# Patient Record
Sex: Female | Born: 1990
Health system: Southern US, Community
[De-identification: ages and names within clinical notes are randomized; demographics above are authoritative.]

## PROBLEM LIST (undated history)

## (undated) ENCOUNTER — Inpatient Hospital Stay (HOSPITAL_COMMUNITY): Payer: Self-pay

## (undated) ENCOUNTER — Emergency Department (HOSPITAL_COMMUNITY): Payer: Medicaid Other

## (undated) DIAGNOSIS — O0930 Supervision of pregnancy with insufficient antenatal care, unspecified trimester: Secondary | ICD-10-CM

## (undated) DIAGNOSIS — Z789 Other specified health status: Secondary | ICD-10-CM

## (undated) DIAGNOSIS — J189 Pneumonia, unspecified organism: Secondary | ICD-10-CM

## (undated) HISTORY — PX: NO PAST SURGERIES: SHX2092

## (undated) HISTORY — DX: Supervision of pregnancy with insufficient antenatal care, unspecified trimester: O09.30

---
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
## 2009-08-01 ENCOUNTER — Inpatient Hospital Stay (HOSPITAL_COMMUNITY): Admission: AD | Admit: 2009-08-01 | Discharge: 2009-08-01 | Payer: Self-pay | Admitting: Obstetrics and Gynecology

## 2009-08-04 ENCOUNTER — Ambulatory Visit: Admission: RE | Admit: 2009-08-04 | Discharge: 2009-08-04 | Payer: Self-pay | Admitting: Obstetrics and Gynecology

## 2009-08-21 DEATH — deceased

## 2009-08-26 ENCOUNTER — Ambulatory Visit: Payer: Self-pay | Admitting: Obstetrics & Gynecology

## 2010-07-11 LAB — POCT PREGNANCY, URINE: Preg Test, Ur: NEGATIVE

## 2010-07-12 LAB — WET PREP, GENITAL: Trich, Wet Prep: NONE SEEN

## 2010-07-12 LAB — URINALYSIS, ROUTINE W REFLEX MICROSCOPIC
Bilirubin Urine: NEGATIVE
Glucose, UA: NEGATIVE mg/dL
Ketones, ur: NEGATIVE mg/dL
Leukocytes, UA: NEGATIVE
Nitrite: NEGATIVE
Protein, ur: NEGATIVE mg/dL
Specific Gravity, Urine: 1.015 (ref 1.005–1.030)
Urobilinogen, UA: 0.2 mg/dL (ref 0.0–1.0)
pH: 7 (ref 5.0–8.0)

## 2010-07-12 LAB — CBC
HCT: 38.5 % (ref 36.0–46.0)
Hemoglobin: 13.1 g/dL (ref 12.0–15.0)
Platelets: 223 10*3/uL (ref 150–400)
RBC: 4.24 MIL/uL (ref 3.87–5.11)
WBC: 8.1 10*3/uL (ref 4.0–10.5)

## 2010-07-12 LAB — ABO/RH: Weak D: NEGATIVE

## 2010-07-12 LAB — RH IMMUNE GLOBULIN WORKUP (NOT WOMEN'S HOSP)
Antibody Screen: NEGATIVE
Weak D: NEGATIVE

## 2010-07-12 LAB — URINE MICROSCOPIC-ADD ON

## 2010-07-12 LAB — GC/CHLAMYDIA PROBE AMP, GENITAL: GC Probe Amp, Genital: NEGATIVE

## 2010-07-12 LAB — POCT PREGNANCY, URINE: Preg Test, Ur: POSITIVE

## 2010-12-29 ENCOUNTER — Encounter (HOSPITAL_COMMUNITY): Payer: Self-pay | Admitting: *Deleted

## 2010-12-29 ENCOUNTER — Inpatient Hospital Stay (HOSPITAL_COMMUNITY)
Admission: AD | Admit: 2010-12-29 | Discharge: 2010-12-29 | Disposition: A | Discharge status: Home / Self Care | Payer: Medicaid Other | Source: Ambulatory Visit | Attending: Family Medicine | Admitting: Family Medicine

## 2010-12-29 ENCOUNTER — Emergency Department (HOSPITAL_COMMUNITY)
Admission: EM | Admit: 2010-12-29 | Discharge: 2010-12-29 | Discharge status: Against Medical Advice | Payer: Medicaid Other | Source: Home / Self Care | Attending: Emergency Medicine | Admitting: Emergency Medicine

## 2010-12-29 DIAGNOSIS — O2650 Maternal hypotension syndrome, unspecified trimester: Secondary | ICD-10-CM | POA: Insufficient documentation

## 2010-12-29 DIAGNOSIS — Z0389 Encounter for observation for other suspected diseases and conditions ruled out: Secondary | ICD-10-CM | POA: Insufficient documentation

## 2010-12-29 HISTORY — DX: Other specified health status: Z78.9

## 2010-12-29 LAB — CBC
MCH: 31.2 pg (ref 26.0–34.0)
MCV: 89.3 fL (ref 78.0–100.0)
Platelets: 167 10*3/uL (ref 150–400)
RDW: 13.3 % (ref 11.5–15.5)
WBC: 8.8 10*3/uL (ref 4.0–10.5)

## 2010-12-29 LAB — URINALYSIS, ROUTINE W REFLEX MICROSCOPIC
Bilirubin Urine: NEGATIVE
Ketones, ur: NEGATIVE mg/dL
Nitrite: NEGATIVE
Protein, ur: NEGATIVE mg/dL
Urobilinogen, UA: 0.2 mg/dL (ref 0.0–1.0)

## 2010-12-29 NOTE — Progress Notes (Signed)
Pt. In Room 5 of [place]. Pt. States that she passed out at work around 1300 this afternoon.

## 2010-12-29 NOTE — Progress Notes (Signed)
At work standing, saw blackness and fainted, employee states she hit her head, fall was broken by co-workers

## 2011-01-29 LAB — RPR: RPR: NONREACTIVE

## 2011-03-02 DIAGNOSIS — O4690 Antepartum hemorrhage, unspecified, unspecified trimester: Secondary | ICD-10-CM

## 2011-03-17 ENCOUNTER — Inpatient Hospital Stay (HOSPITAL_COMMUNITY)
Admission: AD | Admit: 2011-03-17 | Discharge: 2011-03-18 | Disposition: A | Discharge status: Home / Self Care | Payer: Medicaid Other | Source: Ambulatory Visit | Attending: Obstetrics and Gynecology | Admitting: Obstetrics and Gynecology

## 2011-03-17 ENCOUNTER — Encounter (HOSPITAL_COMMUNITY): Payer: Self-pay

## 2011-03-17 DIAGNOSIS — O4690 Antepartum hemorrhage, unspecified, unspecified trimester: Secondary | ICD-10-CM | POA: Insufficient documentation

## 2011-03-17 LAB — URINE MICROSCOPIC-ADD ON

## 2011-03-17 LAB — URINALYSIS, ROUTINE W REFLEX MICROSCOPIC
Glucose, UA: NEGATIVE mg/dL
Specific Gravity, Urine: <1.005 — ABNORMAL LOW (ref 1.005–1.030)

## 2011-03-17 MED ORDER — RHO D IMMUNE GLOBULIN 1500 UNIT/2ML IJ SOLN
300.0000 ug | Freq: Once | INTRAMUSCULAR | Status: AC
  Administered 2011-03-18: 300 ug via INTRAMUSCULAR

## 2011-03-17 NOTE — ED Provider Notes (Signed)
Cynthia Daniels is a 20 y.o. female presenting for eval of vag bldg immed following intercourse. Denies pain or leaking of fluid. Reports +FM. Rh A neg; is a pt at [facility]; has not received Rhogam yet. Maternal Medical History:  Reason for admission: Reason for Admission:   nausea  OB History    Grav Para Term Preterm Abortions TAB SAB Ect Mult Living   2    1  1         Past Medical History  Diagnosis Date  . No pertinent past medical history    Past Surgical History  Procedure Date  . No past surgeries    Family History: family history is not on file. Social History:  reports that she has never smoked. She does not have any smokeless tobacco history on file. She reports that she does not drink alcohol or use illicit drugs.  Review of Systems  Constitutional: Negative for fever and chills.  Gastrointestinal: Negative for nausea and vomiting.  Genitourinary: Negative for dysuria.  Psychiatric/Behavioral: Negative for depression.    Dilation: Closed Effacement (%): Thick Exam by:: kim Teyla Skidgel cnm Blood pressure 115/66, pulse 78, temperature 98.4 F (36.9 C), temperature source Oral, resp. rate 16, height 5' 4.5" (1.638 m), weight 59.932 kg (132 lb 2 oz), last menstrual period 08/20/2010, SpO2 98.00%. Maternal Exam:  Uterine Assessment: Initially irritability/ctx q 3-7 mins, but spaced out by end of time in MAU  Cervix: SSE shows sm/mod amt blood at cx; no active bldg; cx C/L/high  Fetal Exam Fetal Monitor Review: Baseline rate: 140.  Variability: moderate (6-25 bpm).   Pattern: accelerations present and no decelerations.   approp tracing for gest age     Physical Exam  Prenatal labs: ABO, Rh:   Antibody:   Rubella:   RPR:    HBsAg:    HIV:    GBS:     Assessment/Plan: IUP at 29.6wks Postcoital bldg Rh neg  Rhophylac ordered and given Pelvic rest x 1wk Preterm labor and bldg precautions rev'd F/U at [facility] as sched or sooner prn   Cameryn Schum,  Marcin Holte 03/17/2011, 11:04 PM

## 2011-03-17 NOTE — Progress Notes (Signed)
Patient is here with c/o sudden onset vaginal bleeding with moderate size clots about ago. She states that it started after intercourse. She did not have a pad on enroute to hospital. New pad given. She denies any cramping or pain. Her next ob appt is dec. 4th. She reports good fetal movement.

## 2011-03-19 LAB — RH IG WORKUP (INCLUDES ABO/RH)
Gestational Age(Wks): 29.6
Unit division: 0

## 2011-03-20 NOTE — ED Provider Notes (Signed)
Agree with above note.  Cynthia Daniels 03/20/2011 8:49 AM   

## 2011-05-08 ENCOUNTER — Telehealth (HOSPITAL_COMMUNITY): Payer: Self-pay | Admitting: *Deleted

## 2011-05-08 ENCOUNTER — Encounter (HOSPITAL_COMMUNITY): Payer: Self-pay | Admitting: *Deleted

## 2011-05-08 LAB — GC/CHLAMYDIA PROBE AMP, GENITAL

## 2011-05-08 NOTE — Telephone Encounter (Signed)
Preadmission screen  

## 2011-05-13 ENCOUNTER — Encounter (HOSPITAL_COMMUNITY): Payer: Self-pay | Admitting: Pharmacist

## 2011-05-14 ENCOUNTER — Inpatient Hospital Stay (HOSPITAL_COMMUNITY): Payer: Medicaid Other

## 2011-05-14 ENCOUNTER — Encounter (HOSPITAL_COMMUNITY): Payer: Self-pay

## 2011-05-14 ENCOUNTER — Observation Stay (HOSPITAL_COMMUNITY)
Admission: RE | Admit: 2011-05-14 | Discharge: 2011-05-14 | DRG: 782 | Disposition: A | Discharge status: Hospice | Payer: Medicaid Other | Source: Ambulatory Visit | Attending: Obstetrics & Gynecology | Admitting: Obstetrics & Gynecology

## 2011-05-14 DIAGNOSIS — O365990 Maternal care for other known or suspected poor fetal growth, unspecified trimester, not applicable or unspecified: Secondary | ICD-10-CM

## 2011-05-14 LAB — CBC
HCT: 35.4 % — ABNORMAL LOW (ref 36.0–46.0)
Hemoglobin: 12.1 g/dL (ref 12.0–15.0)
MCHC: 34.2 g/dL (ref 30.0–36.0)
RBC: 3.95 MIL/uL (ref 3.87–5.11)

## 2011-05-14 MED ORDER — OXYTOCIN BOLUS FROM INFUSION
500.0000 mL | Freq: Once | INTRAVENOUS | Status: DC
  Filled 2011-05-14: qty 500

## 2011-05-14 MED ORDER — OXYCODONE-ACETAMINOPHEN 5-325 MG PO TABS
2.0000 | ORAL_TABLET | ORAL | Status: DC | PRN
Start: 2011-05-14 — End: 2011-05-14

## 2011-05-14 MED ORDER — LIDOCAINE HCL (PF) 1 % IJ SOLN: 30.0000 mL | INTRAMUSCULAR | Status: DC | PRN

## 2011-05-14 MED ORDER — OXYTOCIN 20 UNITS IN LACTATED RINGERS INFUSION - SIMPLE
125.0000 mL/h | Freq: Once | INTRAVENOUS | Status: DC
Start: 2011-05-14 — End: 2011-05-14

## 2011-05-14 MED ORDER — ONDANSETRON HCL 4 MG/2ML IJ SOLN: 4.0000 mg | Freq: Four times a day (QID) | INTRAMUSCULAR | Status: DC | PRN

## 2011-05-14 MED ORDER — FLEET ENEMA 7-19 GM/118ML RE ENEM: 1.0000 | ENEMA | RECTAL | Status: DC | PRN

## 2011-05-14 MED ORDER — LACTATED RINGERS IV SOLN
INTRAVENOUS | Status: DC
  Administered 2011-05-14: 08:00:00 via INTRAVENOUS

## 2011-05-14 MED ORDER — IBUPROFEN 600 MG PO TABS: 600.0000 mg | ORAL_TABLET | Freq: Four times a day (QID) | ORAL | Status: DC | PRN

## 2011-05-14 MED ORDER — PENICILLIN G POTASSIUM 5000000 UNITS IJ SOLR: 5.0000 10*6.[IU] | Freq: Once | INTRAVENOUS | Status: DC

## 2011-05-14 MED ORDER — CITRIC ACID-SODIUM CITRATE 334-500 MG/5ML PO SOLN: 30.0000 mL | ORAL | Status: DC | PRN

## 2011-05-14 MED ORDER — ACETAMINOPHEN 325 MG PO TABS: 650.0000 mg | ORAL_TABLET | ORAL | Status: DC | PRN

## 2011-05-14 MED ORDER — PENICILLIN G POTASSIUM 5000000 UNITS IJ SOLR: 2.5000 10*6.[IU] | INTRAVENOUS | Status: DC

## 2011-05-14 MED ORDER — LACTATED RINGERS IV SOLN: 500.0000 mL | INTRAVENOUS | Status: DC | PRN

## 2011-05-14 NOTE — Discharge Summary (Signed)
Obstetric Discharge Summary Reason for Admission: induction of labor  Procedures Performed: Ultrasound showing normal intrauterine pregnancy with Fetal Wt at 45%ile  Hemoglobin  Date Value Range Status  05/14/2011 12.1  12.0-15.0 (g/dL) Final     HCT  Date Value Range Status  05/14/2011 35.4* 36.0-46.0 (%) Final    Discharge Diagnoses: Intrauterine Pregnancy, not delivered,   Clinical Course: the patient was scheduled for induction of labor at 38.1wks secondary for IUGR. Apon review of the patients chart, IUGR was not obviously evident. A Repeat Ultrasound was performed and showed viable single intratuerine fetus with estimated weight at 45%ile. The patient was not indicated for induction at this time, and was discharged to home to follow up with her OBGYN within the week for further evaluation. The patient expressed understanding and agreement.   Discharge Information: Date: 05/14/2011 Activity: unrestricted and pelvic rest Diet: routine Medications: PNV Condition: stable Instructions: refer to practice specific booklet Discharge to: home Follow-up Information    Follow up with Lazaro Arms, MD. Schedule an appointment as soon as possible for a visit in 3 days.   Contact information:   [facility] [address] [place] [address] [phone]           Cameron Proud 05/14/2011, 4:18 PM

## 2011-05-16 NOTE — Discharge Summary (Signed)
Agree with note. 

## 2011-05-25 ENCOUNTER — Inpatient Hospital Stay (HOSPITAL_COMMUNITY)
Admission: AD | Admit: 2011-05-25 | Discharge: 2011-05-27 | DRG: 775 | Disposition: A | Discharge status: Home / Self Care | Payer: Medicaid Other | Source: Ambulatory Visit | Attending: Obstetrics and Gynecology | Admitting: Obstetrics and Gynecology

## 2011-05-25 ENCOUNTER — Encounter (HOSPITAL_COMMUNITY): Payer: Self-pay | Admitting: *Deleted

## 2011-05-25 DIAGNOSIS — O99892 Other specified diseases and conditions complicating childbirth: Secondary | ICD-10-CM | POA: Diagnosis present

## 2011-05-25 DIAGNOSIS — IMO0001 Reserved for inherently not codable concepts without codable children: Secondary | ICD-10-CM

## 2011-05-25 DIAGNOSIS — Z22330 Carrier of Group B streptococcus: Secondary | ICD-10-CM

## 2011-05-25 LAB — CBC
MCH: 30.6 pg (ref 26.0–34.0)
MCHC: 34.6 g/dL (ref 30.0–36.0)
MCV: 88.3 fL (ref 78.0–100.0)
RBC: 3.86 MIL/uL — ABNORMAL LOW (ref 3.87–5.11)
WBC: 11.7 10*3/uL — ABNORMAL HIGH (ref 4.0–10.5)

## 2011-05-25 MED ORDER — OXYTOCIN BOLUS FROM INFUSION
500.0000 mL | Freq: Once | INTRAVENOUS | Status: DC
  Filled 2011-05-25: qty 500
  Filled 2011-05-25: qty 1000

## 2011-05-25 MED ORDER — CITRIC ACID-SODIUM CITRATE 334-500 MG/5ML PO SOLN: 30.0000 mL | ORAL | Status: DC | PRN

## 2011-05-25 MED ORDER — ONDANSETRON HCL 4 MG/2ML IJ SOLN: 4.0000 mg | Freq: Four times a day (QID) | INTRAMUSCULAR | Status: DC | PRN

## 2011-05-25 MED ORDER — BUTORPHANOL TARTRATE 2 MG/ML IJ SOLN: 1.0000 mg | INTRAMUSCULAR | Status: DC | PRN

## 2011-05-25 MED ORDER — LACTATED RINGERS IV SOLN
INTRAVENOUS | Status: DC
  Administered 2011-05-25: 125 mL/h via INTRAVENOUS
  Administered 2011-05-25: 22:00:00 via INTRAVENOUS

## 2011-05-25 MED ORDER — OXYTOCIN 20 UNITS IN LACTATED RINGERS INFUSION - SIMPLE
125.0000 mL/h | Freq: Once | INTRAVENOUS | Status: AC
  Administered 2011-05-26: 1000 mL/h via INTRAVENOUS

## 2011-05-25 MED ORDER — IBUPROFEN 600 MG PO TABS
600.0000 mg | ORAL_TABLET | Freq: Four times a day (QID) | ORAL | Status: DC | PRN
  Administered 2011-05-26: 600 mg via ORAL
  Filled 2011-05-25: qty 1

## 2011-05-25 MED ORDER — LIDOCAINE HCL (PF) 1 % IJ SOLN
30.0000 mL | INTRAMUSCULAR | Status: DC | PRN
  Administered 2011-05-26: 30 mL via SUBCUTANEOUS
  Filled 2011-05-25: qty 30

## 2011-05-25 MED ORDER — OXYCODONE-ACETAMINOPHEN 5-325 MG PO TABS: 1.0000 | ORAL_TABLET | ORAL | Status: DC | PRN

## 2011-05-25 MED ORDER — PENICILLIN G POTASSIUM 5000000 UNITS IJ SOLR
5.0000 10*6.[IU] | Freq: Once | INTRAVENOUS | Status: AC
  Administered 2011-05-25: 5 10*6.[IU] via INTRAVENOUS
  Filled 2011-05-25: qty 5

## 2011-05-25 MED ORDER — PENICILLIN G POTASSIUM 5000000 UNITS IJ SOLR
2.5000 10*6.[IU] | INTRAMUSCULAR | Status: DC
  Administered 2011-05-26: 2.5 10*6.[IU] via INTRAVENOUS
  Filled 2011-05-25 (×4): qty 2.5

## 2011-05-25 MED ORDER — ACETAMINOPHEN 325 MG PO TABS: 650.0000 mg | ORAL_TABLET | ORAL | Status: DC | PRN

## 2011-05-25 MED ORDER — FLEET ENEMA 7-19 GM/118ML RE ENEM: 1.0000 | ENEMA | RECTAL | Status: DC | PRN

## 2011-05-25 MED ORDER — LACTATED RINGERS IV SOLN: 500.0000 mL | INTRAVENOUS | Status: DC | PRN

## 2011-05-25 NOTE — ED Notes (Signed)
Report called to [facility] in [place]. Pt to 171 via w/c

## 2011-05-25 NOTE — ED Provider Notes (Signed)
History reviewed.  Patient well known to me, from prenatal care at [facility]. Admitted for labor and delivery. Agree with plan and documentation

## 2011-05-25 NOTE — ED Notes (Signed)
Threasa Heads CNM notified of pt's admission and status. Aware of sve, maternal pulse 120, and FHR baseline 130 before walking and now 150 and reactive. Will admit to [facility]

## 2011-05-25 NOTE — Progress Notes (Signed)
Family at bedside and supportive.

## 2011-05-25 NOTE — ED Provider Notes (Addendum)
History     Chief Complaint  Patient presents with  . Labor Eval   HPI This is a 21 year old G2P0010 at 39 weeks and 5 days who presents to the MAU with contractions that she reports as moderate and occuring every 5-8 minutes that started earlier this morning.  She denies bloody show, vaginal bleeding, vaginal discharge, leaking fluid, headache, vision changes.  Contractions get worse with walking.  OB History    Grav Para Term Preterm Abortions TAB SAB Ect Mult Living   2 0 0 0 1 0 1 0 0 0       Past Medical History  Diagnosis Date  . No pertinent past medical history   . Late prenatal care     Past Surgical History  Procedure Date  . No past surgeries     Family History  Problem Relation Age of Onset  . Asthma Mother   . Depression Mother   . Asthma Maternal Grandmother   . Anesthesia problems Neg Hx   . Hypotension Neg Hx   . Malignant hyperthermia Neg Hx   . Pseudochol deficiency Neg Hx     History  Substance Use Topics  . Smoking status: Never Smoker   . Smokeless tobacco: Not on file  . Alcohol Use: No    Allergies: No Known Allergies  Prescriptions prior to admission  Medication Sig Dispense Refill  . Prenatal Vit-Fe Fumarate-FA (PRENATAL MULTIVITAMIN) TABS Take 1 tablet by mouth at bedtime.        Review of Systems  All other systems reviewed and are negative.   Physical Exam   Blood pressure 116/74, pulse 90, temperature 98.6 F (37 C), temperature source Oral, resp. rate 20, height 5\' 5"  (1.651 m), weight 62.596 kg (138 lb), last menstrual period 08/20/2010, SpO2 99.00%.  Physical Exam  Constitutional: She is oriented to person, place, and time. She appears well-developed and well-nourished. No distress.  GI: Soft. Bowel sounds are normal.       Fundal height at term.  Musculoskeletal: Normal range of motion.  Neurological: She is alert and oriented to person, place, and time. No cranial nerve deficit. Coordination normal.  Skin: Skin is  warm and dry.  Psychiatric: She has a normal mood and affect. Her behavior is normal. Judgment and thought content normal.   Dilation: 4 Effacement (%): 70 Station: -1 Presentation: Vertex Exam by:: Dt Stinson  FHT: 140s, mod variability, accelerations present, no decels Toco: every 3-5 minutes - patient not feeling every contraction  Recheck: 5/90/-1  MAU Course  Procedures  MDM Patient sent to ambulate for 1 hr.  Assessment and Plan  Patient signed out to [facility].   STINSON, JACOB JEHIEL 05/25/2011, 7:50 PM   Assessment: Active labor GBS +  Plan: Admit to [facility] GBS prophylaxis Anticipate NSVD [facility]

## 2011-05-25 NOTE — H&P (Signed)
Note reviewed and confirmed.  Good prognosis for vag delivery

## 2011-05-25 NOTE — Progress Notes (Signed)
Dr Adrian Blackwater discussing plan of care with pt.

## 2011-05-25 NOTE — Progress Notes (Signed)
OUT TO WALK WITH FRIEND- X1 HR- WITH INSTR

## 2011-05-25 NOTE — H&P (Signed)
History     Chief Complaint  Patient presents with  . Labor Eval   HPI This is a 20 year old G2P0010 at 39 weeks and 5 days who presents to the MAU with contractions that she reports as moderate and occuring every 5-8 minutes that started earlier this morning.  She denies bloody show, vaginal bleeding, vaginal discharge, leaking fluid, headache, vision changes.  Contractions get worse with walking.  OB History    Grav Para Term Preterm Abortions TAB SAB Ect Mult Living   2 0 0 0 1 0 1 0 0 0      Past Medical History  Diagnosis Date  . No pertinent past medical history   . Late prenatal care     Past Surgical History  Procedure Date  . No past surgeries     Family History  Problem Relation Age of Onset  . Asthma Mother   . Depression Mother   . Asthma Maternal Grandmother   . Anesthesia problems Neg Hx   . Hypotension Neg Hx   . Malignant hyperthermia Neg Hx   . Pseudochol deficiency Neg Hx     History  Substance Use Topics  . Smoking status: Never Smoker   . Smokeless tobacco: Not on file  . Alcohol Use: No    Allergies: No Known Allergies  Prescriptions prior to admission  Medication Sig Dispense Refill  . Prenatal Vit-Fe Fumarate-FA (PRENATAL MULTIVITAMIN) TABS Take 1 tablet by mouth at bedtime.        Review of Systems  All other systems reviewed and are negative.   Physical Exam   Blood pressure 116/74, pulse 90, temperature 98.6 F (37 C), temperature source Oral, resp. rate 20, height 5' 5" (1.651 m), weight 62.596 kg (138 lb), last menstrual period 08/20/2010, SpO2 99.00%.  Physical Exam  Constitutional: She is oriented to person, place, and time. She appears well-developed and well-nourished. No distress.  GI: Soft. Bowel sounds are normal.       Fundal height at term.  Musculoskeletal: Normal range of motion.  Neurological: She is alert and oriented to person, place, and time. No cranial nerve deficit. Coordination normal.  Skin: Skin is  warm and dry.  Psychiatric: She has a normal mood and affect. Her behavior is normal. Judgment and thought content normal.   Dilation: 4 Effacement (%): 70 Station: -1 Presentation: Vertex Exam by:: Dt Stinson  FHT: 140s, mod variability, accelerations present, no decels Toco: every 3-5 minutes - patient not feeling every contraction  Recheck: 5/90/-1  MAU Course  Procedures  MDM Patient sent to ambulate for 1 hr.  Assessment and Plan  Patient signed out to Yahia Bottger Muhammad.   STINSON, JACOB JEHIEL 05/25/2011, 7:50 PM   Assessment: Active labor GBS +  Plan: Admit to [facility] Suites GBS prophylaxis Anticipate NSVD MUHAMMAD,Keyonna [place]  

## 2011-05-25 NOTE — ED Notes (Signed)
2120 Call to [facility] RN to give report. With pt and will call back to MAU when can.

## 2011-05-25 NOTE — Progress Notes (Signed)
Dull ache in back all afternoon.  Has been contracting q 8-10.  No bleeding or leaking.  G2 , was 3-4 yesterday. No problems with preg

## 2011-05-26 ENCOUNTER — Encounter (HOSPITAL_COMMUNITY): Payer: Self-pay | Admitting: Obstetrics

## 2011-05-26 MED ORDER — PRENATAL MULTIVITAMIN CH
1.0000 | ORAL_TABLET | Freq: Every day | ORAL | Status: DC
  Filled 2011-05-26 (×2): qty 1

## 2011-05-26 MED ORDER — DIBUCAINE 1 % RE OINT: 1.0000 "application " | TOPICAL_OINTMENT | RECTAL | Status: DC | PRN

## 2011-05-26 MED ORDER — PRENATAL MULTIVITAMIN CH
1.0000 | ORAL_TABLET | Freq: Every day | ORAL | Status: DC
  Administered 2011-05-26 – 2011-05-27 (×2): 1 via ORAL

## 2011-05-26 MED ORDER — WITCH HAZEL-GLYCERIN EX PADS: 1.0000 "application " | MEDICATED_PAD | CUTANEOUS | Status: DC | PRN

## 2011-05-26 MED ORDER — BENZOCAINE-MENTHOL 20-0.5 % EX AERO: 1.0000 "application " | INHALATION_SPRAY | CUTANEOUS | Status: DC | PRN

## 2011-05-26 MED ORDER — TETANUS-DIPHTH-ACELL PERTUSSIS 5-2.5-18.5 LF-MCG/0.5 IM SUSP
0.5000 mL | Freq: Once | INTRAMUSCULAR | Status: AC
  Administered 2011-05-27: 0.5 mL via INTRAMUSCULAR
  Filled 2011-05-26: qty 0.5

## 2011-05-26 MED ORDER — IBUPROFEN 600 MG PO TABS
600.0000 mg | ORAL_TABLET | Freq: Four times a day (QID) | ORAL | Status: DC
  Administered 2011-05-26 – 2011-05-27 (×5): 600 mg via ORAL
  Filled 2011-05-26 (×5): qty 1

## 2011-05-26 MED ORDER — LANOLIN HYDROUS EX OINT: TOPICAL_OINTMENT | CUTANEOUS | Status: DC | PRN

## 2011-05-26 MED ORDER — ZOLPIDEM TARTRATE 5 MG PO TABS: 5.0000 mg | ORAL_TABLET | Freq: Every evening | ORAL | Status: DC | PRN

## 2011-05-26 MED ORDER — ONDANSETRON HCL 4 MG/2ML IJ SOLN: 4.0000 mg | INTRAMUSCULAR | Status: DC | PRN

## 2011-05-26 MED ORDER — OXYCODONE-ACETAMINOPHEN 5-325 MG PO TABS
1.0000 | ORAL_TABLET | ORAL | Status: DC | PRN
  Administered 2011-05-26 – 2011-05-27 (×2): 1 via ORAL
  Filled 2011-05-26 (×2): qty 1

## 2011-05-26 MED ORDER — ONDANSETRON HCL 4 MG PO TABS: 4.0000 mg | ORAL_TABLET | ORAL | Status: DC | PRN

## 2011-05-26 MED ORDER — DIPHENHYDRAMINE HCL 25 MG PO CAPS: 25.0000 mg | ORAL_CAPSULE | Freq: Four times a day (QID) | ORAL | Status: DC | PRN

## 2011-05-26 MED ORDER — SIMETHICONE 80 MG PO CHEW: 80.0000 mg | CHEWABLE_TABLET | ORAL | Status: DC | PRN

## 2011-05-26 MED ORDER — SENNOSIDES-DOCUSATE SODIUM 8.6-50 MG PO TABS
2.0000 | ORAL_TABLET | Freq: Every day | ORAL | Status: DC
  Administered 2011-05-26: 2 via ORAL

## 2011-05-26 NOTE — Progress Notes (Signed)
  Subjective: Pt reports increase in contraction pain.  Objective: BP 123/80  Pulse 71  Temp(Src) 98 F (36.7 C) (Oral)  Resp 18  Ht 5\' 5"  (1.651 m)  Wt 62.596 kg (138 lb)  BMI 22.96 kg/m2  SpO2 97%  LMP 08/20/2010      FHT:  FHR: 120's bpm, variability: moderate,  accelerations:  Present,  decelerations:  Absent UC:   regular, every 2-4 minutes SVE:   Dilation: 6 Effacement (%): 100 Station: 0 Exam by:: Larose Kells RN  Labs: Lab Results  Component Value Date   WBC 11.7* 05/25/2011   HGB 11.8* 05/25/2011   HCT 34.1* 05/25/2011   MCV 88.3 05/25/2011   PLT 210 05/25/2011    Assessment / Plan: Spontaneous labor, progressing normally  Labor: Progressing normally Preeclampsia:  n/a Fetal Wellbeing:  Category I Pain Control:  Labor support without medications I/D:  n/a Anticipated MOD:  NSVD  Intermittent monitoring; encouraged position changes (ambulation, out of bed)  [facility] 05/26/2011, 3:27 AM

## 2011-05-26 NOTE — Progress Notes (Signed)
Called [name], CNM to report no cervical change.  States she will come break pt's water.

## 2011-05-26 NOTE — Progress Notes (Signed)
Subjective: Pt reports able to tolerate pain; declines epidural at this time.  No questions or concerns.    Objective: BP 131/82  Pulse 83  Temp(Src) 98 F (36.7 C) (Oral)  Resp 20  Ht 5\' 5"  (1.651 m)  Wt 62.596 kg (138 lb)  BMI 22.96 kg/m2  SpO2 97%  LMP 08/20/2010      FHT:  FHR: 140's bpm, variability: moderate,  accelerations:  Present,  decelerations:  Absent UC:   regular, every 3-4.5 minutes SVE:   Dilation: 5 Effacement (%): 90 Station: -1 Exam by:: Quintella Baton RNC  Labs: Lab Results  Component Value Date   WBC 11.7* 05/25/2011   HGB 11.8* 05/25/2011   HCT 34.1* 05/25/2011   MCV 88.3 05/25/2011   PLT 210 05/25/2011    Assessment / Plan: Active Labor  Labor: Slow Progression Preeclampsia:  n/a Fetal Wellbeing:  Category I Pain Control:  Labor support without medications I/D:  n/a Anticipated MOD:  NSVD  AROM>clear fluid  [facility] 05/26/2011, 1:04 AM

## 2011-05-26 NOTE — Progress Notes (Signed)
Patient ID: Cynthia Daniels, female   DOB: 1991-02-25, 21 y.o.   MRN: [id number] Delivery Note At 4:38 AM a viable and healthy female was delivered via Vertex Occiput Anterior (Presentation: ;  ).  APGAR :8 ,9 ; weight .  6lb 8 oz Placenta status:Schul;tz presentation, intact, 3vc  , .  Cord:3VC  with the following complications:small left periurethral laceration, and perineal laceration with assymmetric superficial skin flap extending to the patient's right side .  Cord pH: n/a  Anesthesia:  Local, 10cc Episiotomy: none  Lacerations: as above Suture Repair: 3.0 monocryl Est. Blood Loss (mL):  350 Mom to postpartum.  Baby to nursery-stable.  Walsie Smeltz V 05/26/2011, 5:09 AM

## 2011-05-27 MED ORDER — IBUPROFEN 600 MG PO TABS: 600.0000 mg | ORAL_TABLET | Freq: Four times a day (QID) | ORAL | Status: AC

## 2011-05-27 MED ORDER — OXYCODONE-ACETAMINOPHEN 5-325 MG PO TABS: 1.0000 | ORAL_TABLET | ORAL | Status: AC | PRN

## 2011-05-27 MED ORDER — RHO D IMMUNE GLOBULIN 1500 UNIT/2ML IJ SOLN
300.0000 ug | Freq: Once | INTRAMUSCULAR | Status: AC
  Administered 2011-05-27: 300 ug via INTRAMUSCULAR
  Filled 2011-05-27: qty 2

## 2011-05-27 NOTE — Discharge Summary (Signed)
Obstetric Discharge Summary Reason for Admission: onset of labor Prenatal Procedures: ultrasound Intrapartum Procedures: spontaneous vaginal delivery Postpartum Procedures: none Complications-Operative and Postpartum: none Hemoglobin  Date Value Range Status  05/25/2011 11.8* 12.0-15.0 (g/dL) Final     HCT  Date Value Range Status  05/25/2011 34.1* 36.0-46.0 (%) Final    Discharge Diagnoses: Term Pregnancy-delivered  Discharge Information: Date: 05/27/2011 Activity: pelvic rest Diet: routine Medications: PNV, Ibuprofen and Percocet Condition: stable and improved Instructions: refer to practice specific booklet Discharge to: home   Newborn Data: Live born female  Birth Weight: 6 lb 8.6 oz (2965 g) APGAR: 9, 9  Home with mother.  Zerita Boers 05/27/2011, 10:43 AM

## 2011-05-27 NOTE — Progress Notes (Signed)
Post Partum Day 1 Subjective: no complaints, up ad lib, voiding and tolerating PO  Objective: Blood pressure 99/62, pulse 80, temperature 98.2 F (36.8 C), temperature source Oral, resp. rate 16, height 5\' 5"  (1.651 m), weight 62.596 kg (138 lb), last menstrual period 08/20/2010, SpO2 98.00%, unknown if currently breastfeeding.  Physical Exam:  General: alert, cooperative, appears stated age and no distress Lochia: appropriate Uterine Fundus: firm Incision: n/a DVT Evaluation: No evidence of DVT seen on physical exam. Negative Homan's sign. No cords or calf tenderness. No significant calf/ankle edema.   Basename 05/25/11 2130  HGB 11.8*  HCT 34.1*    Assessment/Plan: Discharge home, Breastfeeding and Contraception DMPA   LOS: 2 days   Zerita Boers 05/27/2011, 10:39 AM

## 2011-05-27 NOTE — Discharge Summary (Signed)
Agree with above note.  Kamber Vignola H. 05/27/2011 4:36 PM  

## 2011-05-28 LAB — RH IG WORKUP (INCLUDES ABO/RH): Gestational Age(Wks): 39

## 2011-05-28 NOTE — Progress Notes (Signed)
Post discharge chart review completed.  

## 2011-08-14 NOTE — MAU Provider Note (Signed)
Chart reviewed and agree with management and plan.  

## 2011-08-14 NOTE — MAU Provider Note (Signed)
Chief Complaint:  Loss of Consciousness and Dizziness   Cynthia Daniels is  21 y.o. G2P1011.  Patient's last menstrual period was 08/20/2010..   She presents complaining of Loss of Consciousness and Dizziness  Obstetrical/Gynecological History: OB History as of 06/10/11    Grav Para Term Preterm Abortions TAB SAB Ect Mult Living   2 1 1  0 1 0 1 0 0 1      Past Medical History: Past Medical History  Diagnosis Date  . No pertinent past medical history   . Late prenatal care     Past Surgical History: Past Surgical History  Procedure Date  . No past surgeries     Family History: Family History  Problem Relation Age of Onset  . Asthma Mother   . Depression Mother   . Asthma Maternal Grandmother   . Anesthesia problems Neg Hx   . Hypotension Neg Hx   . Malignant hyperthermia Neg Hx   . Pseudochol deficiency Neg Hx     Social History: History  Substance Use Topics  . Smoking status: Never Smoker   . Smokeless tobacco: Not on file  . Alcohol Use: No    Allergies: No Known Allergies  No prescriptions prior to admission    Review of Systems - Negative except what has been reviewed in the HPI  Physical Exam   Blood pressure 102/60, pulse 69, temperature 98.1 F (36.7 C), temperature source Oral, resp. rate 14, weight 120 lb 4 oz (54.545 kg), last menstrual period 08/20/2010, unknown if currently breastfeeding.  General: General appearance - alert, well appearing, and in no distress and oriented to person, place, and time Mental status - alert, oriented to person, place, and time, normal mood, behavior, speech, dress, motor activity, and thought processes, affect appropriate to mood Chest - clear to auscultation, no wheezes, rales or rhonchi, symmetric air entry Heart - normal rate, regular rhythm, normal S1, S2, no murmurs, rubs, clicks or gallops Abdomen - soft, nontender, nondistended, no masses or organomegaly Neurological - alert, oriented, normal speech, no  focal findings or movement disorder noted, neck supple without rigidity, cranial nerves II through XII intact, DTR's normal and symmetric Extremities - peripheral pulses normal, no pedal edema, no clubbing or cyanosis Focused Gynecological Exam: examination not indicated  Labs: No results found for this or any previous visit (from the past 24 hour(s)). Imaging Studies:  No results found.   Assessment: 1. Maternal hypotension syndrome     Plan: Discharge home Precautions reviewed Folow up as schedule in office.  Tag Wurtz E.

## 2012-03-16 ENCOUNTER — Emergency Department (HOSPITAL_COMMUNITY)
Admission: EM | Admit: 2012-03-16 | Discharge: 2012-03-16 | Disposition: A | Discharge status: Home / Self Care | Payer: Self-pay | Source: Home / Self Care | Attending: Emergency Medicine | Admitting: Emergency Medicine

## 2012-03-16 ENCOUNTER — Encounter (HOSPITAL_COMMUNITY): Payer: Self-pay

## 2012-03-16 DIAGNOSIS — L0291 Cutaneous abscess, unspecified: Secondary | ICD-10-CM

## 2012-03-16 MED ORDER — CHLORHEXIDINE GLUCONATE 4 % EX LIQD: 60.0000 mL | Freq: Every day | CUTANEOUS | Status: DC | PRN

## 2012-03-16 MED ORDER — HYDROCODONE-ACETAMINOPHEN 5-325 MG PO TABS: ORAL_TABLET | ORAL | Status: DC

## 2012-03-16 MED ORDER — SULFAMETHOXAZOLE-TMP DS 800-160 MG PO TABS: 2.0000 | ORAL_TABLET | Freq: Two times a day (BID) | ORAL | Status: DC

## 2012-03-16 MED ORDER — MUPIROCIN 2 % EX OINT: TOPICAL_OINTMENT | Freq: Three times a day (TID) | CUTANEOUS | Status: DC

## 2012-03-16 NOTE — ED Provider Notes (Signed)
Chief Complaint  Patient presents with  . Recurrent Skin Infections    History of Present Illness:    Cynthia Daniels is a 21 year old female who has had a one-week history of boils in both her axillas. The boil in the right axilla has been draining. She has a history of a boil on her knee several months ago and her mother had a history of MRSA. She herself denies any personal history of MRSA or diabetes. She has had no fever or chills.  Review of Systems:  Other than noted above, the patient denies any of the following symptoms: Systemic:  No fever, chills or sweats. Skin:  No rash or itching.  PMFSH:  Past medical history, family history, social history, meds, and allergies were reviewed.  No history of diabetes or prior history of abscesses or MRSA.  Physical Exam:   Vital signs:  BP 134/89  Pulse 98  Temp 99.4 F (37.4 C) (Oral)  Resp 18  SpO2 100%  LMP 03/02/2012 Skin:  She has 5 boils in all, the largest is in the right axilla and measures 2.5 cm it is fluctuant. There is a boil just medial to this measuring just a centimeter and it's not fluctuant. She has 3 small boils in the left axilla all measuring about 1.5 cm in size and are not fluctuant or draining.  Skin exam was otherwise normal.  No rash. Ext:  Distal pulses were full, patient has full ROM of all joints.  Procedure:  Verbal informed consent was obtained.  The patient was informed of the risks and benefits of the procedure and understands and accepts.  Identity of the patient was verified verbally and by wristband.   The larger abscess in the right axillary area described above was prepped with Betadine and alcohol and anesthetized with 3 mL of 2% Xylocaine with epinephrine.  Using a #11 scalpel blade, a singe straight incision was made into the area of fluctulence, yielding a large amount of prurulent drainage.  Routine cultures were obtained.  Blunt dissection was used to break up loculations and the resulting wound cavity was  packed with 1/4 inch Iodoform gauze.  A sterile pressure dressing was applied.  Assessment:  The encounter diagnosis was Abscess.  Plan:   1.  The following meds were prescribed:   New Prescriptions   CHLORHEXIDINE (HIBICLENS) 4 % EXTERNAL LIQUID    Apply 60 mLs (4 application total) topically daily as needed.   HYDROCODONE-ACETAMINOPHEN (NORCO/VICODIN) 5-325 MG PER TABLET    1 to 2 tabs every 4 to 6 hours as needed for pain.   MUPIROCIN OINTMENT (BACTROBAN) 2 %    Apply topically 3 (three) times daily.   SULFAMETHOXAZOLE-TRIMETHOPRIM (BACTRIM DS) 800-160 MG PER TABLET    Take 2 tablets by mouth 2 (two) times daily.   2.  The patient was instructed in symptomatic care and handouts were given. 3.  The patient was instructed to leave the dressing in place and return again in 48 hours for packing removal.   Reuben Likes, MD 03/16/12 1921

## 2012-03-16 NOTE — ED Notes (Signed)
Reported boils under both arms , already draining

## 2012-03-18 ENCOUNTER — Encounter (HOSPITAL_COMMUNITY): Payer: Self-pay | Admitting: *Deleted

## 2012-03-18 ENCOUNTER — Emergency Department (INDEPENDENT_AMBULATORY_CARE_PROVIDER_SITE_OTHER)
Admission: EM | Admit: 2012-03-18 | Discharge: 2012-03-18 | Disposition: A | Discharge status: Home / Self Care | Payer: Self-pay | Source: Home / Self Care | Attending: Emergency Medicine | Admitting: Emergency Medicine

## 2012-03-18 DIAGNOSIS — Z5189 Encounter for other specified aftercare: Secondary | ICD-10-CM

## 2012-03-18 DIAGNOSIS — Z4800 Encounter for change or removal of nonsurgical wound dressing: Secondary | ICD-10-CM

## 2012-03-18 NOTE — ED Notes (Signed)
pT  HERE  FOR  WOUND  CHECK  AND  PACKING  REMOVAL        PT REPORTS  SHE  IS  DOING  BETTER  SHE  REPORTS  SHE  GOT  HER  MEDS    FILLED

## 2012-03-18 NOTE — ED Provider Notes (Addendum)
History     CSN: [id number]  Arrival date & time 03/18/12  7829   First MD Initiated Contact with Patient 03/18/12 0920      Chief Complaint  Patient presents with  . Wound Check    (Consider location/radiation/quality/duration/timing/severity/associated sxs/prior treatment) HPI Comments: Patient returns to have packing removal for abscess wound recheck. Tolerating antibiotics well feeling better, mild nausea with antibiotics she has continued to take it. Feels a soreness and swelling have improved remarkably. Denies fevers chills or nails no symptoms or  Patient is a 21 y.o. female presenting with wound check. The history is provided by the patient.  Wound Check  She was treated in the ED today. The fever has been present for 1 to 2 days. Her temperature was unmeasured prior to arrival. There has been bloody discharge from the wound. The redness has improved. The swelling has improved. The pain has no pain.    Past Medical History  Diagnosis Date  . No pertinent past medical history   . Late prenatal care     Past Surgical History  Procedure Date  . No past surgeries     Family History  Problem Relation Age of Onset  . Asthma Mother   . Depression Mother   . Asthma Maternal Grandmother   . Anesthesia problems Neg Hx   . Hypotension Neg Hx   . Malignant hyperthermia Neg Hx   . Pseudochol deficiency Neg Hx     History  Substance Use Topics  . Smoking status: Never Smoker   . Smokeless tobacco: Not on file  . Alcohol Use: No    OB History    Grav Para Term Preterm Abortions TAB SAB Ect Mult Living   2 1 1  0 1 0 1 0 0 1      Review of Systems  Constitutional: Positive for activity change. Negative for fever, chills, diaphoresis, appetite change and fatigue.  Skin: Positive for wound.    Allergies  Review of patient's allergies indicates no known allergies.  Home Medications   Current Outpatient Rx  Name  Route  Sig  Dispense  Refill  . CHLORHEXIDINE  GLUCONATE 4 % EX LIQD   Topical   Apply 60 mLs (4 application total) topically daily as needed.   237 mL   0   . HYDROCODONE-ACETAMINOPHEN 5-325 MG PO TABS      1 to 2 tabs every 4 to 6 hours as needed for pain.   20 tablet   0   . MUPIROCIN 2 % EX OINT   Topical   Apply topically 3 (three) times daily.   22 g   0   . PRENATAL MULTIVITAMIN CH   Oral   Take 1 tablet by mouth at bedtime.         . SULFAMETHOXAZOLE-TMP DS 800-160 MG PO TABS   Oral   Take 2 tablets by mouth 2 (two) times daily.   40 tablet   0     BP 129/83  Pulse 90  Temp 99.2 F (37.3 C) (Oral)  Resp 16  SpO2 99%  LMP 03/02/2012  Physical Exam  Constitutional: She appears well-developed and well-nourished.  Non-toxic appearance. She does not have a sickly appearance. No distress.  HENT:  Head: Normocephalic.  Eyes: Conjunctivae normal are normal.  Neck: Neck supple.  Musculoskeletal:       Right shoulder: She exhibits normal range of motion, no tenderness, no bony tenderness and no swelling.  Arms: Neurological: She is alert.  Skin: No rash noted. There is erythema.    ED Course  Procedures (including critical care time)  Labs Reviewed - No data to display No results found.   1. Wound check, abscess     GRAM POSITIVE COCCI IN PAIRS IN CLUSTERS Culture MODERATE STAPHYLOCOCCUS AUREUS    MDM  Follow-up visit for wound recheck- tolerating antibiotics + clinical  Improvement. To RTC if any further concerns.        Jimmie Molly, MD 03/18/12 1610  Jimmie Molly, MD 03/18/12 1110  Jimmie Molly, MD 03/18/12 [phone]

## 2012-03-19 LAB — CULTURE, ROUTINE-ABSCESS

## 2012-03-27 ENCOUNTER — Telehealth (HOSPITAL_COMMUNITY): Payer: Self-pay | Admitting: *Deleted

## 2012-03-27 NOTE — ED Notes (Signed)
Abscess culture R axilla:  Mod. MRSA.  Pt. adequately treated with Bactrim.  I called and left a message to call. Vassie Moselle 03/27/2012

## 2012-04-10 ENCOUNTER — Telehealth (HOSPITAL_COMMUNITY): Payer: Self-pay | Admitting: *Deleted

## 2012-04-10 NOTE — ED Notes (Signed)
I called pt.  Pt. verified x 2 and given results.  Pt. told she was adequately treated with Bactrim. Pt. given MRSA instructions and voiced understanding. Vassie Moselle 04/10/2012

## 2014-01-19 LAB — PROCEDURE REPORT - SCANNED: PAP SMEAR: NEGATIVE

## 2014-02-22 ENCOUNTER — Encounter (HOSPITAL_COMMUNITY): Payer: Self-pay | Admitting: *Deleted

## 2014-06-05 ENCOUNTER — Inpatient Hospital Stay (HOSPITAL_COMMUNITY)
Admission: AD | Admit: 2014-06-05 | Discharge: 2014-06-05 | Disposition: A | Discharge status: Home / Self Care | Payer: Medicaid Other | Source: Ambulatory Visit | Attending: Obstetrics & Gynecology | Admitting: Obstetrics & Gynecology

## 2014-06-05 ENCOUNTER — Encounter (HOSPITAL_COMMUNITY): Payer: Self-pay | Admitting: Advanced Practice Midwife

## 2014-06-05 DIAGNOSIS — Z3202 Encounter for pregnancy test, result negative: Secondary | ICD-10-CM | POA: Insufficient documentation

## 2014-06-05 DIAGNOSIS — O26851 Spotting complicating pregnancy, first trimester: Secondary | ICD-10-CM | POA: Diagnosis present

## 2014-06-05 HISTORY — DX: Other specified health status: Z78.9

## 2014-06-05 LAB — URINE MICROSCOPIC-ADD ON

## 2014-06-05 LAB — URINALYSIS, ROUTINE W REFLEX MICROSCOPIC
BILIRUBIN URINE: NEGATIVE
GLUCOSE, UA: NEGATIVE mg/dL
KETONES UR: NEGATIVE mg/dL
Nitrite: NEGATIVE
Protein, ur: NEGATIVE mg/dL
SPECIFIC GRAVITY, URINE: 1.015 (ref 1.005–1.030)
Urobilinogen, UA: 0.2 mg/dL (ref 0.0–1.0)
pH: 7 (ref 5.0–8.0)

## 2014-06-05 LAB — CBC
HCT: 38.4 % (ref 36.0–46.0)
HEMOGLOBIN: 13 g/dL (ref 12.0–15.0)
MCH: 30.3 pg (ref 26.0–34.0)
MCHC: 33.9 g/dL (ref 30.0–36.0)
MCV: 89.5 fL (ref 78.0–100.0)
Platelets: 238 10*3/uL (ref 150–400)
RBC: 4.29 MIL/uL (ref 3.87–5.11)
RDW: 13 % (ref 11.5–15.5)
WBC: 7.1 10*3/uL (ref 4.0–10.5)

## 2014-06-05 LAB — WET PREP, GENITAL
CLUE CELLS WET PREP: NONE SEEN
TRICH WET PREP: NONE SEEN
YEAST WET PREP: NONE SEEN

## 2014-06-05 LAB — POCT PREGNANCY, URINE
PREG TEST UR: NEGATIVE
Preg Test, Ur: POSITIVE — AB

## 2014-06-05 LAB — HCG, QUANTITATIVE, PREGNANCY: hCG, Beta Chain, Quant, S: <1 m[IU]/mL (ref <5)

## 2014-06-05 MED ORDER — RHO D IMMUNE GLOBULIN 1500 UNIT/2ML IJ SOSY
300.0000 ug | PREFILLED_SYRINGE | Freq: Once | INTRAMUSCULAR | Status: AC
  Administered 2014-06-05: 300 ug via INTRAMUSCULAR
  Filled 2014-06-05: qty 2

## 2014-06-05 NOTE — MAU Provider Note (Signed)
History     CSN: [id number]  Arrival date and time: 06/05/14 1750   First Provider Initiated Contact with Patient 06/05/14 1831      Chief Complaint  Patient presents with  . Vaginal Bleeding   HPI This is a 24 y.o. female at [redacted]w[redacted]d who presents with c/o spotting all week. Has some cramping. Has not started prenatal care.  UPT at home 2 days ago was negative  RN Note: Patient presents with complaint of spotting since Wednesday.          OB History    Gravida Para Term Preterm AB TAB SAB Ectopic Multiple Living   0 1 0 1 0 0 1      Past Medical History  Diagnosis Date  . No pertinent past medical history   . Late prenatal care   . Medical history non-contributory     Past Surgical History  Procedure Laterality Date  . No past surgeries      Family History  Problem Relation Age of Onset  . Asthma Mother   . Depression Mother   . Asthma Maternal Grandmother   . Anesthesia problems Neg Hx   . Hypotension Neg Hx   . Malignant hyperthermia Neg Hx   . Pseudochol deficiency Neg Hx     History  Substance Use Topics  . Smoking status: Never Smoker   . Smokeless tobacco: Never Used  . Alcohol Use: No    Allergies: No Known Allergies  Prescriptions prior to admission  Medication Sig Dispense Refill Last Dose  . chlorhexidine (HIBICLENS) 4 % external liquid Apply 60 mLs (4 application total) topically daily as needed. 237 mL 0   . HYDROcodone-acetaminophen (NORCO/VICODIN) 5-325 MG per tablet 1 to 2 tabs every 4 to 6 hours as needed for pain. 20 tablet 0   . mupirocin ointment (BACTROBAN) 2 % Apply topically 3 (three) times daily. 22 g 0   . Prenatal Vit-Fe Fumarate-FA (PRENATAL MULTIVITAMIN) TABS Take 1 tablet by mouth at bedtime.   05/25/2011 at Unknown  . sulfamethoxazole-trimethoprim (BACTRIM DS) 800-160 MG per tablet Take 2 tablets by mouth 2 (two) times daily. 40 tablet 0     Review of Systems  Constitutional: Negative for fever and chills.   Gastrointestinal: Positive for abdominal pain. Negative for nausea, vomiting, diarrhea and constipation.  Genitourinary:       Spotting   Neurological: Negative for dizziness.   Physical Exam   Blood pressure 130/83, pulse 95, temperature 98.3 F (36.8 C), temperature source Oral, resp. rate 18, height  (1.651 m), weight 134 lb 3.2 oz (60.873 kg), last menstrual period 05/16/2014, SpO2 100 %.  Physical Exam  Constitutional: She is oriented to person, place, and time. She appears well-developed and well-nourished. No distress.  HENT:  Head: Normocephalic.  Cardiovascular: Normal rate and regular rhythm.  Exam reveals no gallop and no friction rub.   No murmur heard. Respiratory: Effort normal.  GI: Soft. She exhibits no distension. There is no tenderness. There is no rebound and no guarding.  Genitourinary:  No blood in vault Cervix closed Uterus small and nontender  Musculoskeletal: Normal range of motion.  Neurological: She is alert and oriented to person, place, and time.  Skin: Skin is warm and dry.  Psychiatric: She has a normal mood and affect.    MAU Course  Procedures  MDM Cultures done, Quant ordered. RhIg ordered  Assessment and Plan  Will await Quant HCG. If very low, may not  do US tonight.  Report to oncoming provider  [facility] CenterWILLIAMS,MARIE 06/05/2014, 6:32 PM   Results for orders placed or performed during the hospital encounter of 06/05/14 (from the past 24 hour(s))  Urinalysis, Routine w reflex microscopic     Status: Abnormal   Collection Time: 06/05/14  6:00 PM  Result Value Ref Range   Color, Urine YELLOW YELLOW   APPearance CLEAR CLEAR   Specific Gravity, Urine 1.015 1.005 - 1.030   pH 7.0 5.0 - 8.0   Glucose, UA NEGATIVE NEGATIVE mg/dL   Hgb urine dipstick TRACE (A) NEGATIVE   Bilirubin Urine NEGATIVE NEGATIVE   Ketones, ur NEGATIVE NEGATIVE mg/dL   Protein, ur NEGATIVE NEGATIVE mg/dL   Urobilinogen, UA 0.2 0.0 - 1.0 mg/dL   Nitrite NEGATIVE  NEGATIVE   Leukocytes, UA TRACE (A) NEGATIVE  Urine microscopic-add on     Status: Abnormal   Collection Time: 06/05/14  6:00 PM  Result Value Ref Range   Squamous Epithelial / LPF FEW (A) RARE   WBC, UA 3-6 <3 WBC/hpf   RBC / HPF 0-2 <3 RBC/hpf  Pregnancy, urine POC     Status: None   Collection Time: 06/05/14  6:08 PM  Result Value Ref Range   Preg Test, Ur NEGATIVE NEGATIVE  Pregnancy, urine POC     Status: Abnormal   Collection Time: 06/05/14  6:12 PM  Result Value Ref Range   Preg Test, Ur POSITIVE (A) NEGATIVE  CBC     Status: None   Collection Time: 06/05/14  6:21 PM  Result Value Ref Range   WBC 7.1 4.0 - 10.5 K/uL   RBC 4.29 3.87 - 5.11 MIL/uL   Hemoglobin 13.0 12.0 - 15.0 g/dL   HCT 40.138.4 02.736.0 - 25.346.0 %   MCV 89.5 78.0 - 100.0 fL   MCH 30.3 26.0 - 34.0 pg   MCHC 33.9 30.0 - 36.0 g/dL   RDW 66.413.0 40.311.5 - 47.415.5 %   Platelets 238 150 - 400 K/uL  hCG, quantitative, pregnancy     Status: None   Collection Time: 06/05/14  6:35 PM  Result Value Ref Range   hCG, Beta Chain, Quant, S <1 <5 mIU/mL  Rh IG workup (includes ABO/Rh)     Status: None (Preliminary result)   Collection Time: 06/05/14  6:35 PM  Result Value Ref Range   Gestational Age(Wks) 5    ABO/RH(D) A NEG    Antibody Screen NEG    Unit Number [id number]    Blood Component Type RHIG    Unit division 00    Status of Unit ISSUED    Transfusion Status OK TO TRANSFUSE   Wet prep, genital     Status: Abnormal   Collection Time: 06/05/14  6:54 PM  Result Value Ref Range   Yeast Wet Prep HPF POC NONE SEEN NONE SEEN   Trich, Wet Prep NONE SEEN NONE SEEN   Clue Cells Wet Prep HPF POC NONE SEEN NONE SEEN   WBC, Wet Prep HPF POC FEW (A) NONE SEEN    MDM UPT - faint positive Quant hCG < 1, result repeated to verify  A: Negative pregnancy test  P: Discharge home Patient advised to retake HPT in 2-3 weeks if no period Patient may return to MAU as needed or if her condition were to change or worsen   Marny LowensteinJulie  N Jezel Basto, PA-C  06/05/2014 8:55 PM

## 2014-06-05 NOTE — Discharge Instructions (Signed)
Pregnancy Tests °HOW DO PREGNANCY TESTS WORK? °All pregnancy tests look for a special hormone in the urine or blood that is only present in pregnant women. This hormone, human chorionic gonadotropin (hCG), is also called the pregnancy hormone.  °WHAT IS THE DIFFERENCE BETWEEN A URINE AND A BLOOD PREGNANCY TEST? IS ONE BETTER THAN THE OTHER? °There are two types of pregnancy tests. °· Blood tests. °· Urine tests. °Both tests look for the presence of hCG, the pregnancy hormone. Many women use a urine test or home pregnancy test (HPT) to find out if they are pregnant. HPTs are cheap, easy to use, can be done at home, and are private. When a woman has a positive result on an HPT, she needs to see her caregiver right away. The caregiver can confirm a positive HPT result with another urine test, a blood test, ultrasound, and a pelvic exam.  °There are two types of blood tests you can get from a caregiver.  °· A quantitative blood test (or the beta hCG test). This test measures the exact amount of hCG in the blood. This means it can pick up very small amounts of hCG, making it a very accurate test. °· A qualitative hCG blood test. This test gives a simple yes or no answer to whether you are pregnant. This test is more like a urine test in terms of its accuracy. °Blood tests can pick up hCG earlier in a pregnancy than urine tests can. Blood tests can tell if you are pregnant about 6 to 8 days after you release an egg from an ovary (ovulate). Urine tests can determine pregnancy about 2 weeks after ovulation.  °HOW IS A HOME PREGNANCY TEST DONE?  °There are many types of home pregnancy tests or HPTs that can be bought over-the-counter at drug or discount stores.  °· Some involve collecting your urine in a cup and dipping a stick into the urine or putting some of the urine into a special container with an eyedropper. °· Others are done by placing a stick into your urine stream. °· Tests vary in how long you need to wait for  the stick or container to turn a certain color or have a symbol on it (like a plus or a minus). °· All tests come with written instructions. Most tests also have toll-free phone numbers to call if you have any questions about how to do the test or read the results. °HOW ACCURATE ARE HOME PREGNANCY TESTS?  °HPTs are very accurate. Most brands of HPTs say they are 97% to 99% accurate when taken 1 week after missing your menstrual period, but this can vary with actual use. Each brand varies in how sensitive it is in picking up the pregnancy hormone hCG. If a test is not done correctly, it will be less accurate. Always check the package to make sure it is not past its expiration date. If it is, it will not be accurate. Most brands of HPTs tell users to do the test again in a few days, no matter what the results.  °If you use an HPT too early in your pregnancy, you may not have enough of the pregnancy hormone hCG in your urine to have a positive test result. Most HPTs will be accurate if you test yourself around the time your period is due (about 2 weeks after you ovulate). You can get a negative test result if you are not pregnant or if you ovulated later than you thought you did.   You may also have problems with the pregnancy, which affects the amount of hCG you have in your urine. If your HPT is negative, test yourself again within a few days to 1 week. If you keep getting a negative result and think you are pregnant, talk with your caregiver right away about getting a blood pregnancy test.  °FALSE POSITIVE PREGNANCY TEST °A false positive HPT can happen if there is blood or protein present in your urine. A false positive can also happen if you were recently pregnant or if you take a pregnancy test too soon after taking fertility drug that contains hCG. Also, some prescription medicines such as water pills (diuretics), tranquilizers, seizure medicines, psychiatric medicines, and allergy and nausea medicines  (promethazine) give false positive readings. °FALSE NEGATIVE PREGNANCY TEST °· A false negative HPT can happen if you do the test too early. Try to wait until you are at least 1 day late for your menstrual period. °· It may happen if you wait too long to test the urine (longer than 15 minutes). °· It may also happen if the urine is too diluted because you drank a lot of fluids before getting the urine sample. It is best to test the first morning urine after you get out of bed. °If your menstrual period did not start after a week of a negative HPT, repeat the pregnancy test. °CAN ANYTHING INTERFERE WITH HOME PREGNANCY TEST RESULTS?  °Most medicines, both over-the-counter and prescription drugs, including birth control pills and antibiotics, should not affect the results of a HPT. Only those drugs that have the pregnancy hormone hCG in them can give a false positive test result. Drugs that have hCG in them may be used for treating infertility (not being able to get pregnant). Alcohol and illegal drugs do not affect HPT results, but you should not be using these substances if you are trying to get pregnant. If you have a positive pregnancy test, call your caregiver to make an appointment to begin prenatal care. °Document Released: 04/12/2003 Document Revised: 07/02/2011 Document Reviewed: 07/24/2013 °[employer]® Patient Information ©2015 [employer]. This information is not intended to replace advice given to you by your health care provider. Make sure you discuss any questions you have with your health care provider. ° °

## 2014-06-05 NOTE — MAU Note (Signed)
Patient presents with complaint of spotting since Wednesday.

## 2014-06-06 LAB — RH IG WORKUP (INCLUDES ABO/RH)
ABO/RH(D): A NEG
ANTIBODY SCREEN: NEGATIVE
GESTATIONAL AGE(WKS): 5
UNIT DIVISION: 0

## 2014-06-07 LAB — HIV ANTIBODY (ROUTINE TESTING W REFLEX): HIV Screen 4th Generation wRfx: NONREACTIVE

## 2014-06-08 LAB — GC/CHLAMYDIA PROBE AMP (~~LOC~~) NOT AT ARMC
Chlamydia: NEGATIVE
NEISSERIA GONORRHEA: NEGATIVE

## 2015-04-10 ENCOUNTER — Encounter (HOSPITAL_COMMUNITY): Payer: Self-pay | Admitting: *Deleted

## 2015-05-06 ENCOUNTER — Inpatient Hospital Stay (HOSPITAL_COMMUNITY)
Admission: AD | Admit: 2015-05-06 | Discharge: 2015-05-06 | Disposition: A | Discharge status: Home / Self Care | Payer: Medicaid Other | Source: Ambulatory Visit | Attending: Obstetrics & Gynecology | Admitting: Obstetrics & Gynecology

## 2015-05-06 ENCOUNTER — Encounter (HOSPITAL_COMMUNITY): Payer: Self-pay | Admitting: *Deleted

## 2015-05-06 DIAGNOSIS — N946 Dysmenorrhea, unspecified: Secondary | ICD-10-CM

## 2015-05-06 LAB — CBC
HEMATOCRIT: 37.9 % (ref 36.0–46.0)
Hemoglobin: 12.9 g/dL (ref 12.0–15.0)
MCH: 30.6 pg (ref 26.0–34.0)
MCHC: 34 g/dL (ref 30.0–36.0)
MCV: 90 fL (ref 78.0–100.0)
Platelets: 266 10*3/uL (ref 150–400)
RBC: 4.21 MIL/uL (ref 3.87–5.11)
RDW: 12.5 % (ref 11.5–15.5)
WBC: 6.4 10*3/uL (ref 4.0–10.5)

## 2015-05-06 LAB — WET PREP, GENITAL
CLUE CELLS WET PREP: NONE SEEN
Sperm: NONE SEEN
Trich, Wet Prep: NONE SEEN
Yeast Wet Prep HPF POC: NONE SEEN

## 2015-05-06 LAB — HIV ANTIBODY (ROUTINE TESTING W REFLEX): HIV Screen 4th Generation wRfx: NONREACTIVE

## 2015-05-06 LAB — URINALYSIS, ROUTINE W REFLEX MICROSCOPIC
Bilirubin Urine: NEGATIVE
GLUCOSE, UA: NEGATIVE mg/dL
Ketones, ur: NEGATIVE mg/dL
Nitrite: NEGATIVE
Protein, ur: NEGATIVE mg/dL
Specific Gravity, Urine: <1.005 — ABNORMAL LOW (ref 1.005–1.030)
pH: 6 (ref 5.0–8.0)

## 2015-05-06 LAB — URINE MICROSCOPIC-ADD ON

## 2015-05-06 LAB — RPR: RPR Ser Ql: NONREACTIVE

## 2015-05-06 LAB — POCT PREGNANCY, URINE: Preg Test, Ur: NEGATIVE

## 2015-05-06 LAB — GC/CHLAMYDIA PROBE AMP (~~LOC~~) NOT AT ARMC
Chlamydia: NEGATIVE
Neisseria Gonorrhea: NEGATIVE

## 2015-05-06 MED ORDER — KETOROLAC TROMETHAMINE 60 MG/2ML IM SOLN
60.0000 mg | Freq: Once | INTRAMUSCULAR | Status: AC
  Administered 2015-05-06: 60 mg via INTRAMUSCULAR
  Filled 2015-05-06: qty 2

## 2015-05-06 NOTE — MAU Note (Addendum)
PT SAYS  SHE   HAD INPLANON  INSERTED  IN LEFT  ARM  IN 06-2014  AT  PLANNED  PARENTHOOD.       SAYS  CYCLES  HAVE  BEEN  LONGER  AND  BLEEDING  WORSE   SINCE  THEN.       THIS  CYCLE    STARTED  ON 1-4   -=-     HAS  BAD  CRAMPS  AND BACK HURTS  AND   HER  NECK  HURTS,   SHE  FEELS  LIGHT  HEADED .  Marland Kitchen.     PAD   ON IN  TRIAGE -  SPOTS  OF  LIGHT  PINK.         SHE  TOOK IBUPROFEN  AT 9PM    600MG -     NO RELIEF   TOOK TYLENOL  AT  12  NOON-    REG  2 TABS

## 2015-05-06 NOTE — Discharge Instructions (Signed)
Dysmenorrhea Menstrual cramps (dysmenorrhea) are caused by the muscles of the uterus tightening (contracting) during a menstrual period. For some women, this discomfort is merely bothersome. For others, dysmenorrhea can be severe enough to interfere with everyday activities for a few days each month. Primary dysmenorrhea is menstrual cramps that last a couple of days when you start having menstrual periods or soon after. This often begins after a teenager starts having her period. As a woman gets older or has a baby, the cramps will usually lessen or disappear. Secondary dysmenorrhea begins later in life, lasts longer, and the pain may be stronger than primary dysmenorrhea. The pain may start before the period and last a few days after the period.  CAUSES  Dysmenorrhea is usually caused by an underlying problem, such as:  The tissue lining the uterus grows outside of the uterus in other areas of the body (endometriosis).  The endometrial tissue, which normally lines the uterus, is found in or grows into the muscular walls of the uterus (adenomyosis).  The pelvic blood vessels are engorged with blood just before the menstrual period (pelvic congestive syndrome).  Overgrowth of cells (polyps) in the lining of the uterus or cervix.  Falling down of the uterus (prolapse) because of loose or stretched ligaments.  Depression.  Bladder problems, infection, or inflammation.  Problems with the intestine, a tumor, or irritable bowel syndrome.  Cancer of the female organs or bladder.  A severely tipped uterus.  A very tight opening or closed cervix.  Noncancerous tumors of the uterus (fibroids).  Pelvic inflammatory disease (PID).  Pelvic scarring (adhesions) from a previous surgery.  Ovarian cyst.  An intrauterine device (IUD) used for birth control. RISK FACTORS You may be at greater risk of dysmenorrhea if:  You are younger than age 30.  You started puberty early.  You have  irregular or heavy bleeding.  You have never given birth.  You have a family history of this problem.  You are a smoker. SIGNS AND SYMPTOMS   Cramping or throbbing pain in your lower abdomen.  Headaches.  Lower back pain.  Nausea or vomiting.  Diarrhea.  Sweating or dizziness.  Loose stools. DIAGNOSIS  A diagnosis is based on your history, symptoms, physical exam, diagnostic tests, or procedures. Diagnostic tests or procedures may include:  Blood tests.  Ultrasonography.  An examination of the lining of the uterus (dilation and curettage, D&C).  An examination inside your abdomen or pelvis with a scope (laparoscopy).  X-rays.  CT scan.  MRI.  An examination inside the bladder with a scope (cystoscopy).  An examination inside the intestine or stomach with a scope (colonoscopy, gastroscopy). TREATMENT  Treatment depends on the cause of the dysmenorrhea. Treatment may include:  Pain medicine prescribed by your health care provider.  Birth control pills or an IUD with progesterone hormone in it.  Hormone replacement therapy.  Nonsteroidal anti-inflammatory drugs (NSAIDs). These may help stop the production of prostaglandins.  Surgery to remove adhesions, endometriosis, ovarian cyst, or fibroids.  Removal of the uterus (hysterectomy).  Progesterone shots to stop the menstrual period.  Cutting the nerves on the sacrum that go to the female organs (presacral neurectomy).  Electric current to the sacral nerves (sacral nerve stimulation).  Antidepressant medicine.  Psychiatric therapy, counseling, or group therapy.  Exercise and physical therapy.  Meditation and yoga therapy.  Acupuncture. HOME CARE INSTRUCTIONS   Only take over-the-counter or prescription medicines as directed by your health care provider.  Place a heating pad   or hot water bottle on your lower back or abdomen. Do not sleep with the heating pad.  Use aerobic exercises, walking,  swimming, biking, and other exercises to help lessen the cramping.  Massage to the lower back or abdomen may help.  Stop smoking.  Avoid alcohol and caffeine. SEEK MEDICAL CARE IF:   Your pain does not get better with medicine.  You have pain with sexual intercourse.  Your pain increases and is not controlled with medicines.  You have abnormal vaginal bleeding with your period.  You develop nausea or vomiting with your period that is not controlled with medicine. SEEK IMMEDIATE MEDICAL CARE IF:  You pass out.    This information is not intended to replace advice given to you by your health care provider. Make sure you discuss any questions you have with your health care provider.   Document Released: 04/09/2005 Document Revised: 12/10/2012 Document Reviewed: 09/25/2012 Elsevier Interactive Patient Education 2016 Elsevier Inc. Contraception Choices Contraception (birth control) is the use of any methods or devices to prevent pregnancy. Below are some methods to help avoid pregnancy. HORMONAL METHODS   Contraceptive implant. This is a thin, plastic tube containing progesterone hormone. It does not contain estrogen hormone. Your health care provider inserts the tube in the inner part of the upper arm. The tube can remain in place for up to 3 years. After 3 years, the implant must be removed. The implant prevents the ovaries from releasing an egg (ovulation), thickens the cervical mucus to prevent sperm from entering the uterus, and thins the lining of the inside of the uterus.  Progesterone-only injections. These injections are given every 3 months by your health care provider to prevent pregnancy. This synthetic progesterone hormone stops the ovaries from releasing eggs. It also thickens cervical mucus and changes the uterine lining. This makes it harder for sperm to survive in the uterus.  Birth control pills. These pills contain estrogen and progesterone hormone. They work by  preventing the ovaries from releasing eggs (ovulation). They also cause the cervical mucus to thicken, preventing the sperm from entering the uterus. Birth control pills are prescribed by a health care provider.Birth control pills can also be used to treat heavy periods.  Minipill. This type of birth control pill contains only the progesterone hormone. They are taken every day of each month and must be prescribed by your health care provider.  Birth control patch. The patch contains hormones similar to those in birth control pills. It must be changed once a week and is prescribed by a health care provider.  Vaginal ring. The ring contains hormones similar to those in birth control pills. It is left in the vagina for 3 weeks, removed for 1 week, and then a new one is put back in place. The patient must be comfortable inserting and removing the ring from the vagina.A health care provider's prescription is necessary.  Emergency contraception. Emergency contraceptives prevent pregnancy after unprotected sexual intercourse. This pill can be taken right after sex or up to 5 days after unprotected sex. It is most effective the sooner you take the pills after having sexual intercourse. Most emergency contraceptive pills are available without a prescription. Check with your pharmacist. Do not use emergency contraception as your only form of birth control. BARRIER METHODS   Female condom. This is a thin sheath (latex or rubber) that is worn over the penis during sexual intercourse. It can be used with spermicide to increase effectiveness.  Female condom. This is   a soft, loose-fitting sheath that is put into the vagina before sexual intercourse.  Diaphragm. This is a soft, latex, dome-shaped barrier that must be fitted by a health care provider. It is inserted into the vagina, along with a spermicidal jelly. It is inserted before intercourse. The diaphragm should be left in the vagina for 6 to 8 hours after  intercourse.  Cervical cap. This is a round, soft, latex or plastic cup that fits over the cervix and must be fitted by a health care provider. The cap can be left in place for up to 48 hours after intercourse.  Sponge. This is a soft, circular piece of polyurethane foam. The sponge has spermicide in it. It is inserted into the vagina after wetting it and before sexual intercourse.  Spermicides. These are chemicals that kill or block sperm from entering the cervix and uterus. They come in the form of creams, jellies, suppositories, foam, or tablets. They do not require a prescription. They are inserted into the vagina with an applicator before having sexual intercourse. The process must be repeated every time you have sexual intercourse. INTRAUTERINE CONTRACEPTION  Intrauterine device (IUD). This is a T-shaped device that is put in a woman's uterus during a menstrual period to prevent pregnancy. There are 2 types:  Copper IUD. This type of IUD is wrapped in copper wire and is placed inside the uterus. Copper makes the uterus and fallopian tubes produce a fluid that kills sperm. It can stay in place for 10 years.  Hormone IUD. This type of IUD contains the hormone progestin (synthetic progesterone). The hormone thickens the cervical mucus and prevents sperm from entering the uterus, and it also thins the uterine lining to prevent implantation of a fertilized egg. The hormone can weaken or kill the sperm that get into the uterus. It can stay in place for 3-5 years, depending on which type of IUD is used. PERMANENT METHODS OF CONTRACEPTION  Female tubal ligation. This is when the woman's fallopian tubes are surgically sealed, tied, or blocked to prevent the egg from traveling to the uterus.  Hysteroscopic sterilization. This involves placing a small coil or insert into each fallopian tube. Your doctor uses a technique called hysteroscopy to do the procedure. The device causes scar tissue to form. This  results in permanent blockage of the fallopian tubes, so the sperm cannot fertilize the egg. It takes about 3 months after the procedure for the tubes to become blocked. You must use another form of birth control for these 3 months.  Female sterilization. This is when the female has the tubes that carry sperm tied off (vasectomy).This blocks sperm from entering the vagina during sexual intercourse. After the procedure, the man can still ejaculate fluid (semen). NATURAL PLANNING METHODS  Natural family planning. This is not having sexual intercourse or using a barrier method (condom, diaphragm, cervical cap) on days the woman could become pregnant.  Calendar method. This is keeping track of the length of each menstrual cycle and identifying when you are fertile.  Ovulation method. This is avoiding sexual intercourse during ovulation.  Symptothermal method. This is avoiding sexual intercourse during ovulation, using a thermometer and ovulation symptoms.  Post-ovulation method. This is timing sexual intercourse after you have ovulated. Regardless of which type or method of contraception you choose, it is important that you use condoms to protect against the transmission of sexually transmitted infections (STIs). Talk with your health care provider about which form of contraception is most appropriate for you.     This information is not intended to replace advice given to you by your health care provider. Make sure you discuss any questions you have with your health care provider.   Document Released: 04/09/2005 Document Revised: 04/14/2013 Document Reviewed: 10/02/2012 Elsevier Interactive Patient Education 2016 Elsevier Inc.  

## 2015-05-06 NOTE — MAU Provider Note (Signed)
History     CSN: [id number]  Arrival date and time: 05/06/15 0151   First Provider Initiated Contact with Patient 05/06/15 0215      Chief Complaint  Patient presents with  . Abdominal Pain   Pelvic Pain The patient's primary symptoms include pelvic pain and vaginal bleeding. This is a new problem. Episode onset: 3 days ago. The problem occurs intermittently. The problem has been gradually worsening. The pain is moderate (6/10). The problem affects both sides. She is not pregnant. Associated symptoms include abdominal pain and nausea. Pertinent negatives include no chills, constipation, diarrhea, dysuria, fever, frequency, urgency or vomiting. The vaginal discharge was bloody. The vaginal bleeding is typical of menses (heavier in the morning ). She has not been passing clots. She has tried NSAIDs for the symptoms. The treatment provided no relief. She is sexually active. It is unknown whether or not her partner has an STD. Contraceptive use: Nexplanon    Past Medical History  Diagnosis Date  . No pertinent past medical history   . Late prenatal care   . Medical history non-contributory     Past Surgical History  Procedure Laterality Date  . No past surgeries      Family History  Problem Relation Age of Onset  . Asthma Mother   . Depression Mother   . Asthma Maternal Grandmother   . Anesthesia problems Neg Hx   . Hypotension Neg Hx   . Malignant hyperthermia Neg Hx   . Pseudochol deficiency Neg Hx     Social History  Substance Use Topics  . Smoking status: Never Smoker   . Smokeless tobacco: Never Used  . Alcohol Use: No    Allergies: No Known Allergies  No prescriptions prior to admission    Review of Systems  Constitutional: Negative for fever and chills.  Gastrointestinal: Positive for nausea and abdominal pain. Negative for vomiting, diarrhea and constipation.  Genitourinary: Positive for pelvic pain. Negative for dysuria, urgency and frequency.   Physical  Exam   Blood pressure 121/71, pulse 84, temperature 98.2 F (36.8 C), temperature source Oral, resp. rate 18, height 5\' 4"  (1.626 m), weight 66.849 kg (147 lb 6 oz), last menstrual period 04/27/2015, unknown if currently breastfeeding.  Physical Exam  Nursing note and vitals reviewed. Constitutional: She is oriented to person, place, and time. She appears well-developed and well-nourished. No distress.  HENT:  Head: Normocephalic.  Cardiovascular: Normal rate.   Respiratory: Effort normal.  GI: Soft. There is no tenderness. There is no rebound.  Genitourinary:   External: no lesion Vagina: small amount of white discharge Cervix: pink, smooth, no CMT Uterus: NSSC Adnexa: NT   Neurological: She is alert and oriented to person, place, and time.  Skin: Skin is warm and dry.  Psychiatric: She has a normal mood and affect.   Results for orders placed or performed during the hospital encounter of 05/06/15 (from the past 24 hour(s))  Urinalysis, Routine w reflex microscopic (not at [facility])     Status: Abnormal   Collection Time: 05/06/15  2:13 AM  Result Value Ref Range   Color, Urine YELLOW YELLOW   APPearance CLEAR CLEAR   Specific Gravity, Urine <1.005 (L) 1.005 - 1.030   pH 6.0 5.0 - 8.0   Glucose, UA NEGATIVE NEGATIVE mg/dL   Hgb urine dipstick LARGE (A) NEGATIVE   Bilirubin Urine NEGATIVE NEGATIVE   Ketones, ur NEGATIVE NEGATIVE mg/dL   Protein, ur NEGATIVE NEGATIVE mg/dL   Nitrite NEGATIVE NEGATIVE  Leukocytes, UA SMALL (A) NEGATIVE  Urine microscopic-add on     Status: Abnormal   Collection Time: 05/06/15  2:13 AM  Result Value Ref Range   Squamous Epithelial / LPF 0-5 (A) NONE SEEN   WBC, UA 0-5 0 - 5 WBC/hpf   RBC / HPF 0-5 0 - 5 RBC/hpf   Bacteria, UA RARE (A) NONE SEEN  Wet prep, genital     Status: Abnormal   Collection Time: 05/06/15  2:27 AM  Result Value Ref Range   Yeast Wet Prep HPF POC NONE SEEN NONE SEEN   Trich, Wet Prep NONE SEEN NONE SEEN   Clue Cells  Wet Prep HPF POC NONE SEEN NONE SEEN   WBC, Wet Prep HPF POC MODERATE (A) NONE SEEN   Sperm NONE SEEN   CBC     Status: None   Collection Time: 05/06/15  2:30 AM  Result Value Ref Range   WBC 6.4 4.0 - 10.5 K/uL   RBC 4.21 3.87 - 5.11 MIL/uL   Hemoglobin 12.9 12.0 - 15.0 g/dL   HCT 16.1 09.6 - 04.5 %   MCV 90.0 78.0 - 100.0 fL   MCH 30.6 26.0 - 34.0 pg   MCHC 34.0 30.0 - 36.0 g/dL   RDW 40.9 81.1 - 91.4 %   Platelets 266 150 - 400 K/uL  Pregnancy, urine POC     Status: None   Collection Time: 05/06/15  2:41 AM  Result Value Ref Range   Preg Test, Ur NEGATIVE NEGATIVE    MAU Course  Procedures  MDM Patient has had toradol. She is feeling better.  Assessment and Plan   1. Dysmenorrhea    DC home Comfort measures reviewed  RX: none  Return to MAU as needed   Follow-up Information    Schedule an appointment as soon as possible for a visit with [facility].   Contact information:   [address] [place] [address] [phone]      Tawnya Crook 05/06/2015, 3:47 AM

## 2015-08-30 ENCOUNTER — Emergency Department (HOSPITAL_BASED_OUTPATIENT_CLINIC_OR_DEPARTMENT_OTHER)
Admission: EM | Admit: 2015-08-30 | Discharge: 2015-08-30 | Disposition: A | Discharge status: Home / Self Care | Payer: Medicaid Other | Attending: Emergency Medicine | Admitting: Emergency Medicine

## 2015-08-30 ENCOUNTER — Encounter (HOSPITAL_BASED_OUTPATIENT_CLINIC_OR_DEPARTMENT_OTHER): Payer: Self-pay | Admitting: *Deleted

## 2015-08-30 DIAGNOSIS — R110 Nausea: Secondary | ICD-10-CM

## 2015-08-30 DIAGNOSIS — R112 Nausea with vomiting, unspecified: Secondary | ICD-10-CM | POA: Diagnosis not present

## 2015-08-30 DIAGNOSIS — R42 Dizziness and giddiness: Secondary | ICD-10-CM | POA: Diagnosis present

## 2015-08-30 LAB — URINALYSIS, ROUTINE W REFLEX MICROSCOPIC
Bilirubin Urine: NEGATIVE
GLUCOSE, UA: NEGATIVE mg/dL
Hgb urine dipstick: NEGATIVE
Ketones, ur: NEGATIVE mg/dL
Nitrite: NEGATIVE
Protein, ur: NEGATIVE mg/dL
Specific Gravity, Urine: 1.011 (ref 1.005–1.030)
pH: 6 (ref 5.0–8.0)

## 2015-08-30 LAB — BASIC METABOLIC PANEL
Anion gap: 4 — ABNORMAL LOW (ref 5–15)
BUN: 12 mg/dL (ref 6–20)
CO2: 26 mmol/L (ref 22–32)
Calcium: 9.1 mg/dL (ref 8.9–10.3)
Chloride: 106 mmol/L (ref 101–111)
Creatinine, Ser: 0.56 mg/dL (ref 0.44–1.00)
GFR calc Af Amer: >60 mL/min (ref >60)
Glucose, Bld: 113 mg/dL — ABNORMAL HIGH (ref 65–99)
Potassium: 3.8 mmol/L (ref 3.5–5.1)
Sodium: 136 mmol/L (ref 135–145)

## 2015-08-30 LAB — CBC WITH DIFFERENTIAL/PLATELET
BASOS PCT: 0 %
Basophils Absolute: 0 10*3/uL (ref 0.0–0.1)
Eosinophils Absolute: 0.1 10*3/uL (ref 0.0–0.7)
Eosinophils Relative: 1 %
HEMATOCRIT: 38.2 % (ref 36.0–46.0)
HEMOGLOBIN: 12.9 g/dL (ref 12.0–15.0)
LYMPHS ABS: 2.3 10*3/uL (ref 0.7–4.0)
Lymphocytes Relative: 31 %
MCH: 30.5 pg (ref 26.0–34.0)
MCHC: 33.8 g/dL (ref 30.0–36.0)
MCV: 90.3 fL (ref 78.0–100.0)
MONOS PCT: 7 %
Monocytes Absolute: 0.5 10*3/uL (ref 0.1–1.0)
NEUTROS ABS: 4.4 10*3/uL (ref 1.7–7.7)
Neutrophils Relative %: 61 %
Platelets: 229 10*3/uL (ref 150–400)
RBC: 4.23 MIL/uL (ref 3.87–5.11)
RDW: 12.2 % (ref 11.5–15.5)
WBC: 7.2 10*3/uL (ref 4.0–10.5)

## 2015-08-30 LAB — URINE MICROSCOPIC-ADD ON

## 2015-08-30 LAB — HCG, SERUM, QUALITATIVE: Preg, Serum: NEGATIVE

## 2015-08-30 LAB — PREGNANCY, URINE: PREG TEST UR: NEGATIVE

## 2015-08-30 MED ORDER — ONDANSETRON 8 MG PO TBDP: ORAL_TABLET | ORAL | Status: DC

## 2015-08-30 NOTE — ED Provider Notes (Signed)
CSN: [id number]     Arrival date & time 08/30/15  1448 History   First MD Initiated Contact with Patient 08/30/15 1547     Chief Complaint  Patient presents with  . Dizziness     (Consider location/radiation/quality/duration/timing/severity/associated sxs/prior Treatment) HPI Comments: Patient is a 25 year old female with no significant past medical history. She presents for evaluation of nausea and generalized malaise for the past week. This is been occurring intermittently. She reports some vomiting and also feels intermittently dizzy. She denies abdominal pain or diarrhea. Her last menstrual period was last month and normal. She states she has taken 2 pregnancy tests at home, and both have been negative. She denies any fevers or chills. She denies any ill contacts.  The history is provided by the patient.    Past Medical History  Diagnosis Date  . No pertinent past medical history   . Late prenatal care   . Medical history non-contributory    Past Surgical History  Procedure Laterality Date  . No past surgeries     Family History  Problem Relation Age of Onset  . Asthma Mother   . Depression Mother   . Asthma Maternal Grandmother   . Anesthesia problems Neg Hx   . Hypotension Neg Hx   . Malignant hyperthermia Neg Hx   . Pseudochol deficiency Neg Hx    Social History  Substance Use Topics  . Smoking status: Never Smoker   . Smokeless tobacco: Never Used  . Alcohol Use: No   OB History    Gravida Para Term Preterm AB TAB SAB Ectopic Multiple Living   0 1 0 1 0 0 1     Review of Systems  All other systems reviewed and are negative.     Allergies  Review of patient's allergies indicates no known allergies.  Home Medications   Prior to Admission medications   Medication Sig Start Date End Date Taking? Authorizing Provider  acetaminophen (TYLENOL) 500 MG tablet Take 1,000 mg by mouth every 6 (six) hours as needed for mild pain.    Historical Provider, MD    BP 136/74 mmHg  Pulse 109  Temp(Src) 98.7 F (37.1 C) (Oral)  Resp 18  Ht  (1.651 m)  Wt 135 lb (61.236 kg)  BMI 22.47 kg/m2  SpO2 100%  LMP 08/09/2015 Physical Exam  Constitutional: She is oriented to person, place, and time. She appears well-developed and well-nourished. No distress.  HENT:  Head: Normocephalic and atraumatic.  Eyes: EOM are normal. Pupils are equal, round, and reactive to light.  Neck: Normal range of motion. Neck supple.  Cardiovascular: Normal rate and regular rhythm.  Exam reveals no gallop and no friction rub.   No murmur heard. Pulmonary/Chest: Effort normal and breath sounds normal. No respiratory distress. She has no wheezes.  Abdominal: Soft. Bowel sounds are normal. She exhibits no distension. There is no tenderness.  Musculoskeletal: Normal range of motion.  Neurological: She is alert and oriented to person, place, and time.  Skin: Skin is warm and dry. She is not diaphoretic.  Nursing note and vitals reviewed.   ED Course  Procedures (including critical care time) Labs Review Labs Reviewed  PREGNANCY, URINE  URINALYSIS, ROUTINE W REFLEX MICROSCOPIC (NOT AT [facility])  BASIC METABOLIC PANEL  CBC WITH DIFFERENTIAL/PLATELET  HCG, SERUM, QUALITATIVE    Imaging Review No results found. I have personally reviewed and evaluated these images and lab results as part of my medical decision-making.  MDM   Final diagnoses:  None    Workup reveals essentially unremarkable laboratory studies. Her urinalysis is clear and pregnancy test is negative. I am uncertain as to the exact etiology of her nausea, however this may be viral. She appears well and I highly doubt any emergent condition. She will be discharged with Zofran and when necessary return.    Geoffery Lyonsouglas Tannah Dreyfuss, MD 08/30/15 [id number]

## 2015-08-30 NOTE — ED Notes (Signed)
Dizziness off and on for a week. Nausea. No pain.

## 2015-08-30 NOTE — Discharge Instructions (Signed)
Zofran as prescribed as needed for nausea.  Return to the emergency department if you develop any new and concerning symptoms.   Nausea, Adult Nausea is the feeling that you have an upset stomach or have to vomit. Nausea by itself is not likely a serious concern, but it may be an early sign of more serious medical problems. As nausea gets worse, it can lead to vomiting. If vomiting develops, there is the risk of dehydration.  CAUSES   Viral infections.  Food poisoning.  Medicines.  Pregnancy.  Motion sickness.  Migraine headaches.  Emotional distress.  Severe pain from any source.  Alcohol intoxication. HOME CARE INSTRUCTIONS  Get plenty of rest.  Ask your caregiver about specific rehydration instructions.  Eat small amounts of food and sip liquids more often.  Take all medicines as told by your caregiver. SEEK MEDICAL CARE IF:  You have not improved after 2 days, or you get worse.  You have a headache. SEEK IMMEDIATE MEDICAL CARE IF:   You have a fever.  You faint.  You keep vomiting or have blood in your vomit.  You are extremely weak or dehydrated.  You have dark or bloody stools.  You have severe chest or abdominal pain. MAKE SURE YOU:  Understand these instructions.  Will watch your condition.  Will get help right away if you are not doing well or get worse.   This information is not intended to replace advice given to you by your health care provider. Make sure you discuss any questions you have with your health care provider.   Document Released: 05/17/2004 Document Revised: 04/30/2014 Document Reviewed: 12/20/2010 Elsevier Interactive Patient Education [employer]2016 Elsevier Inc.

## 2016-01-17 ENCOUNTER — Encounter: Payer: Self-pay | Admitting: *Deleted

## 2016-03-17 ENCOUNTER — Encounter (HOSPITAL_BASED_OUTPATIENT_CLINIC_OR_DEPARTMENT_OTHER): Payer: Self-pay | Admitting: *Deleted

## 2016-03-17 ENCOUNTER — Emergency Department (HOSPITAL_BASED_OUTPATIENT_CLINIC_OR_DEPARTMENT_OTHER): Payer: Medicaid Other

## 2016-03-17 ENCOUNTER — Emergency Department (HOSPITAL_BASED_OUTPATIENT_CLINIC_OR_DEPARTMENT_OTHER)
Admission: EM | Admit: 2016-03-17 | Discharge: 2016-03-17 | Disposition: A | Discharge status: Home / Self Care | Payer: Medicaid Other | Attending: Emergency Medicine | Admitting: Emergency Medicine

## 2016-03-17 DIAGNOSIS — J189 Pneumonia, unspecified organism: Secondary | ICD-10-CM | POA: Diagnosis not present

## 2016-03-17 DIAGNOSIS — R05 Cough: Secondary | ICD-10-CM | POA: Diagnosis present

## 2016-03-17 MED ORDER — AZITHROMYCIN 250 MG PO TABS
500.0000 mg | ORAL_TABLET | Freq: Once | ORAL | Status: AC
  Administered 2016-03-17: 500 mg via ORAL
  Filled 2016-03-17: qty 2

## 2016-03-17 MED ORDER — AZITHROMYCIN 250 MG PO TABS: 250.0000 mg | ORAL_TABLET | Freq: Every day | ORAL | 0 refills | Status: DC

## 2016-03-17 MED ORDER — BENZONATATE 100 MG PO CAPS
100.0000 mg | ORAL_CAPSULE | Freq: Once | ORAL | Status: AC
  Administered 2016-03-17: 100 mg via ORAL
  Filled 2016-03-17: qty 1

## 2016-03-17 MED ORDER — BENZONATATE 100 MG PO CAPS: 100.0000 mg | ORAL_CAPSULE | Freq: Three times a day (TID) | ORAL | 0 refills | Status: DC

## 2016-03-17 NOTE — ED Provider Notes (Signed)
[facility] Provider Note   CSN: [id number] Arrival date & time: 03/17/16  1414  By signing my name below, I, Emmanuella Mensah, attest that this documentation has been prepared under the direction and in the presence of Renne CriglerJoshua Levonia Wolfley, PA-C . Electronically Signed: Angelene GiovanniEmmanuella Mensah, ED Scribe. 03/17/16. 4:29 PM.   History   Chief Complaint Chief Complaint  Patient presents with  . Cough    HPI Comments: Cynthia ReadingJessica Daniels is a 25 y.o. female who presents to the Emergency Department complaining of gradually worsening persistent moderate non-productive cough onset one week ago. She reports associated chest wall pain s/p coughing. She notes that when she begins to walk, she has episodes of shortness of breath but that is alleviated when she stands still. No alleviating factors noted. Pt states that she has tried OTC medications with no relief. She has NKDA. She denies a personal and family hx of PE/DVT. Pt is currently on oral birth control pills. She denies that she is a current smoker. She also denies any fever, chills, congestion, rhinorrhea, sore throat, vomiting, or any other symptoms. Denies lower extremity swelling or tenderness. Denies h/o DVT/PE.   The history is provided by the patient. No language interpreter was used.    Past Medical History:  Diagnosis Date  . Late prenatal care   . Medical history non-contributory   . No pertinent past medical history     Patient Active Problem List   Diagnosis Date Noted  . Vaginal delivery 05/26/2011    Past Surgical History:  Procedure Laterality Date  . NO PAST SURGERIES      OB History    Gravida Para Term Preterm AB Living   2 1 1  0 1 1   SAB TAB Ectopic Multiple Live Births   1 0 0 0 1       Home Medications    Prior to Admission medications   Medication Sig Start Date End Date Taking? Authorizing Provider  PRESCRIPTION MEDICATION Birth Control Pills   Yes Historical Provider, MD  acetaminophen  (TYLENOL) 500 MG tablet Take 1,000 mg by mouth every 6 (six) hours as needed for mild pain.    Historical Provider, MD  ondansetron (ZOFRAN ODT) 8 MG disintegrating tablet 8mg  ODT q4 hours prn nausea 08/30/15   Geoffery Lyonsouglas Delo, MD    Family History Family History  Problem Relation Age of Onset  . Asthma Mother   . Depression Mother   . Asthma Maternal Grandmother   . Anesthesia problems Neg Hx   . Hypotension Neg Hx   . Malignant hyperthermia Neg Hx   . Pseudochol deficiency Neg Hx     Social History Social History  Substance Use Topics  . Smoking status: Never Smoker  . Smokeless tobacco: Never Used  . Alcohol use No     Allergies   Patient has no known allergies.   Review of Systems Review of Systems  Constitutional: Negative for chills and fever.  HENT: Negative for congestion, rhinorrhea and sore throat.   Eyes: Negative for redness.  Respiratory: Positive for cough and shortness of breath.   Cardiovascular: Negative for chest pain.  Gastrointestinal: Negative for abdominal pain, diarrhea, nausea and vomiting.  Genitourinary: Negative for dysuria.  Musculoskeletal: Negative for myalgias.  Skin: Negative for rash.  Neurological: Negative for headaches.     Physical Exam Updated Vital Signs BP 141/85 (BP Location: Left Arm)   Pulse 102   Temp 98.7 F (37.1 C) (Oral)   Resp 22  Ht 5\' 5"  (1.651 m)   Wt 138 lb (62.6 kg)   LMP 03/12/2016   SpO2 98%   BMI 22.96 kg/m   Physical Exam  Constitutional: She appears well-developed and well-nourished.  HENT:  Head: Normocephalic and atraumatic.  Right Ear: Tympanic membrane, external ear and ear canal normal.  Left Ear: Tympanic membrane, external ear and ear canal normal.  Nose: Nose normal. No mucosal edema or rhinorrhea.  Mouth/Throat: Uvula is midline, oropharynx is clear and moist and mucous membranes are normal. Mucous membranes are not dry. No oral lesions. No trismus in the jaw. No uvula swelling. No  oropharyngeal exudate, posterior oropharyngeal edema, posterior oropharyngeal erythema or tonsillar abscesses.  Eyes: Conjunctivae are normal. Right eye exhibits no discharge. Left eye exhibits no discharge.  Neck: Normal range of motion. Neck supple.  Cardiovascular: Normal rate, regular rhythm and normal heart sounds.   Pulmonary/Chest: Effort normal and breath sounds normal. No respiratory distress. She has no wheezes. She has no rales.  Frequent coughing during exam.  Abdominal: Soft. There is no tenderness.  Lymphadenopathy:    She has no cervical adenopathy.  Neurological: She is alert.  Skin: Skin is warm and dry.  Psychiatric: She has a normal mood and affect.  Nursing note and vitals reviewed.    ED Treatments / Results  DIAGNOSTIC STUDIES: Oxygen Saturation is 98% on RA, normal by my interpretation.    COORDINATION OF CARE: 4:27 PM- Pt advised of plan for treatment and pt agrees. Pt informed of her chest x-ray results. She will receive a Z-pack and Tessalon. Advised to rest and take time off work; pt declines work note. Return precautions discussed with high fever, chest pains, worsening SOB or leg swelling. Pt will follow up with PCP.   Radiology Dg Chest 2 View  Result Date: 03/17/2016 CLINICAL DATA:  25 year old female with 1 week history of nonproductive cough and low-grade fever EXAM: CHEST  2 VIEW COMPARISON:  None. FINDINGS: Patchy airspace opacity in the lingula concerning for bronchopneumonia. The cardiac and mediastinal contours are normal. There is no pleural effusion or pneumothorax. Osseous structures are intact and unremarkable. IMPRESSION: Patchy airspace opacity in the lingula concerning for bronchopneumonia. Electronically Signed   By: Malachy Moan M.D.   On: 03/17/2016 15:20    Procedures Procedures (including critical care time)  Medications Ordered in ED Medications  azithromycin (ZITHROMAX) tablet 500 mg (not administered)  benzonatate  (TESSALON) capsule 100 mg (not administered)     Initial Impression / Assessment and Plan / ED Course  Renne Crigler, PA-C has reviewed the triage vital signs and the nursing notes.  Pertinent labs & imaging results that were available during my care of the patient were reviewed by me and considered in my medical decision making (see chart for details).  Clinical Course    Patient seen and examined.  Medications ordered. Informed of x-ray results.  Vital signs reviewed and are as follows: BP 141/85 (BP Location: Left Arm)   Pulse 102   Temp 98.7 F (37.1 C) (Oral)   Resp 22   Ht 5\' 5"  (1.651 m)   Wt 62.6 kg   LMP 03/12/2016   SpO2 98%   BMI 22.96 kg/m   Patient encouraged to return to the emergency department with worsening difficulty breathing, shortness of breath, lower extremity swelling, new symptoms or other concerns. She verbalizes understanding and agrees with plan.  Final Clinical Impressions(s) / ED Diagnoses   Final diagnoses:  Community acquired pneumonia of left  lung, unspecified part of lung   Patient with community-acquired lingula pneumonia. Other symptoms are coughing. She is not hypoxic. Overall her clinical picture is consistent with pneumonia. Low suspicion for PE. Patient does not currently have any signs of DVT. She is not hypoxic. Return instructions as above.  New Prescriptions New Prescriptions   AZITHROMYCIN (ZITHROMAX) 250 MG TABLET    Take 1 tablet (250 mg total) by mouth daily.   BENZONATATE (TESSALON) 100 MG CAPSULE    Take 1 capsule (100 mg total) by mouth every 8 (eight) hours.   I personally performed the services described in this documentation, which was scribed in my presence. The recorded information has been reviewed and is accurate.    Renne Crigler, PA-C 03/17/16 1642    Arby Barrette, MD 03/19/16 [phone]

## 2016-03-17 NOTE — Discharge Instructions (Signed)
Please read and follow all provided instructions.  Your diagnoses today include:  1. Community acquired pneumonia of left lung, unspecified part of lung     Tests performed today include:  Chest x-ray -- shows pneumonia  Vital signs. See below for your results today.   Medications prescribed:   Azithromycin - antibiotic for respiratory infection  You have been prescribed an antibiotic medicine: take the entire course of medicine even if you are feeling better. Stopping early can cause the antibiotic not to work.   Tessalon Perles - cough suppressant medication  Take any prescribed medications only as directed.  Home care instructions:  Follow any educational materials contained in this packet.  Take the complete course of antibiotics that you were prescribed.   BE VERY CAREFUL not to take multiple medicines containing Tylenol (also called acetaminophen). Doing so can lead to an overdose which can damage your liver and cause liver failure and possibly death.   Follow-up instructions: Please follow-up with your primary care provider in the next 3 days for further evaluation of your symptoms and to ensure resolution of your infection.   Return instructions:   Please return to the Emergency Department if you experience worsening symptoms.   Return immediately with worsening breathing, worsening shortness of breath, or if you feel it is taking you more effort to breathe.   Please return if you have any other emergent concerns.  Additional Information:  Your vital signs today were: BP 141/85 (BP Location: Left Arm)    Pulse 102    Temp 98.7 F (37.1 C) (Oral)    Resp 22    Ht 5\' 5"  (1.651 m)    Wt 62.6 kg    LMP 03/12/2016    SpO2 98%    BMI 22.96 kg/m  If your blood pressure (BP) was elevated above 135/85 this visit, please have this repeated by your doctor within one month. --------------

## 2016-03-17 NOTE — ED Triage Notes (Signed)
Patient states she has a one week history of a non productive cough.  Using OTC meds with no relief.

## 2016-03-21 ENCOUNTER — Emergency Department (HOSPITAL_BASED_OUTPATIENT_CLINIC_OR_DEPARTMENT_OTHER): Payer: Medicaid Other

## 2016-03-21 ENCOUNTER — Encounter (HOSPITAL_BASED_OUTPATIENT_CLINIC_OR_DEPARTMENT_OTHER): Payer: Self-pay

## 2016-03-21 ENCOUNTER — Emergency Department (HOSPITAL_BASED_OUTPATIENT_CLINIC_OR_DEPARTMENT_OTHER)
Admission: EM | Admit: 2016-03-21 | Discharge: 2016-03-21 | Disposition: A | Discharge status: Home / Self Care | Payer: Medicaid Other | Attending: Emergency Medicine | Admitting: Emergency Medicine

## 2016-03-21 DIAGNOSIS — R05 Cough: Secondary | ICD-10-CM | POA: Insufficient documentation

## 2016-03-21 DIAGNOSIS — R0789 Other chest pain: Secondary | ICD-10-CM | POA: Diagnosis not present

## 2016-03-21 DIAGNOSIS — R059 Cough, unspecified: Secondary | ICD-10-CM

## 2016-03-21 DIAGNOSIS — R0602 Shortness of breath: Secondary | ICD-10-CM | POA: Insufficient documentation

## 2016-03-21 HISTORY — DX: Pneumonia, unspecified organism: J18.9

## 2016-03-21 MED ORDER — PREDNISONE 20 MG PO TABS: 40.0000 mg | ORAL_TABLET | Freq: Every day | ORAL | 0 refills | Status: DC

## 2016-03-21 MED ORDER — GUAIFENESIN-DM 100-10 MG/5ML PO SYRP: 5.0000 mL | ORAL_SOLUTION | ORAL | 0 refills | Status: DC | PRN

## 2016-03-21 NOTE — ED Provider Notes (Signed)
[facility]   CSN: [id number] Arrival date & time: 03/21/16  1436  By signing my name below, I, Cynthia Daniels, attest that this documentation has been prepared under the direction and in the presence of Kristeena Meineke, PA-C.  Electronically Signed: Rosario AdieWilliam Andrew Daniels, ED Scribe. 03/21/16. 4:20 PM.  History   Chief Complaint Chief Complaint  Patient presents with  . Pneumonia   The history is provided by the patient and medical records. No language interpreter was used.    HPI Comments: Cynthia Daniels is a 25 y.o. female with no pertinent chronic medical conditions, who presents to the Emergency Department complaining of unchanged, persistent non-productive cough onset approximately two weeks ago. Pt notes associated chest tightness secondary to her cough only and mild SOB. Pt was seen on 03/19/16 (~4 days ago), and at that time she had a CXR performed which was remarkable for bronchopneumonia of the lingula. Pt was d/c at that time with rx'd Zithromax and Tessalon pearls which she has been taking with minimal relief of her cough. Pt has also been taking Nyquil at home with minimal relief of her cough as well. Pt states that when she was last seen in the ED that she had a subjective fever, however, it has since resolved. Pt is a non-smoker. No h/o asthma or prior inhaler uses for any reason. She denies fever, chills, congestion, rhinorrhea, or any other associated symptoms.   Past Medical History:  Diagnosis Date  . Late prenatal care   . Medical history non-contributory   . No pertinent past medical history   . Pneumonia    Patient Active Problem List   Diagnosis Date Noted  . Vaginal delivery 05/26/2011   Past Surgical History:  Procedure Laterality Date  . NO PAST SURGERIES     OB History    Gravida Para Term Preterm AB Living   2 1 1  0 1 1   SAB TAB Ectopic Multiple Live Births   1 0 0 0 1     Home Medications    Prior to  Admission medications   Medication Sig Start Date End Date Taking? Authorizing Provider  acetaminophen (TYLENOL) 500 MG tablet Take 1,000 mg by mouth every 6 (six) hours as needed for mild pain.    Historical Provider, MD  benzonatate (TESSALON) 100 MG capsule Take 1 capsule (100 mg total) by mouth every 8 (eight) hours. 03/17/16   Renne CriglerJoshua Geiple, PA-C  PRESCRIPTION MEDICATION Birth Control Pills    Historical Provider, MD   Family History Family History  Problem Relation Age of Onset  . Asthma Mother   . Depression Mother   . Asthma Maternal Grandmother   . Anesthesia problems Neg Hx   . Hypotension Neg Hx   . Malignant hyperthermia Neg Hx   . Pseudochol deficiency Neg Hx    Social History Social History  Substance Use Topics  . Smoking status: Never Smoker  . Smokeless tobacco: Never Used  . Alcohol use No   Allergies   Patient has no known allergies.  Review of Systems Review of Systems  Constitutional: Negative for chills and fever.  HENT: Negative for congestion and rhinorrhea.   Respiratory: Positive for cough, chest tightness and shortness of breath.    Physical Exam Updated Vital Signs BP 136/80 (BP Location: Left Arm)   Pulse 100   Temp 98 F (36.7 C) (Oral)   Resp 18   Wt 139 lb (63 kg)   LMP 03/12/2016  SpO2 100%   BMI 23.13 kg/m   Physical Exam  Constitutional: She appears well-developed and well-nourished. No distress.  HENT:  Head: Normocephalic and atraumatic.  Eyes: Conjunctivae are normal.  Neck: Normal range of motion. Neck supple.  Cardiovascular: Normal rate, regular rhythm and normal heart sounds.   Pulmonary/Chest: Effort normal and breath sounds normal. No respiratory distress. She has no wheezes. She has no rales.  Abdominal: Soft. Bowel sounds are normal. She exhibits no distension. There is no tenderness. There is no rebound.  Musculoskeletal: Normal range of motion. She exhibits no edema.  Neurological: She is alert.  Skin: Skin is  warm and dry. No pallor.  Psychiatric: She has a normal mood and affect. Her behavior is normal.  Nursing note and vitals reviewed.  ED Treatments / Results  DIAGNOSTIC STUDIES: Oxygen Saturation is 100% on RA, normal by my interpretation.   COORDINATION OF CARE: 4:19 PM-Discussed next steps with pt including an rx for a short burst of Prednisone and continued usage of Tessalon. Pt verbalized understanding and is agreeable with the plan.   Radiology Dg Chest 2 View  Result Date: 03/21/2016 CLINICAL DATA:  Pneumonia 4 days ago EXAM: CHEST  2 VIEW COMPARISON:  03/17/2016 FINDINGS: Cardiomediastinal silhouette is stable. Improvement in aeration in lingula. No definite residual infiltrate. No pulmonary edema. IMPRESSION: Improvement in aeration in lingula without residual infiltrate. No pulmonary edema. Electronically Signed   By: Natasha MeadLiviu  Pop M.D.   On: 03/21/2016 15:54   Procedures Procedures   Medications Ordered in ED Medications - No data to display  Initial Impression / Assessment and Plan / ED Course  I have reviewed the triage vital signs and the nursing notes.  Pertinent labs & imaging results that were available during my care of the patient were reviewed by me and considered in my medical decision making (see chart for details).  Clinical Course    Pt in ED for recheck. Was diagnosed with CAP 4 days ago. Cough is not improving. Pt with non productive cough. Lungs clear. VS normal. Repeat CXR is clear. Plan to DC home with prednisone burst, continue cough suppressants, follow up as needed.   Vitals:   03/21/16 1441 03/21/16 1640  BP: 136/80 114/83  Pulse: 100 89  Resp: 18 18  Temp: 98 F (36.7 C)   TempSrc: Oral   SpO2: 100% 99%  Weight: 63 kg      Final Clinical Impressions(s) / ED Diagnoses   Final diagnoses:  Cough   New Prescriptions Discharge Medication List as of 03/21/2016  4:25 PM    START taking these medications   Details    guaiFENesin-dextromethorphan (ROBITUSSIN DM) 100-10 MG/5ML syrup Take 5 mLs by mouth every 4 (four) hours as needed for cough., Starting Wed 03/21/2016, Print    predniSONE (DELTASONE) 20 MG tablet Take 2 tablets (40 mg total) by mouth daily., Starting Wed 03/21/2016, Print       I personally performed the services described in this documentation, which was scribed in my presence. The recorded information has been reviewed and is accurate.     Jaynie Crumbleatyana Laurin Morgenstern, PA-C 03/21/16 1644    Maia PlanJoshua G Long, MD 03/22/16 1126

## 2016-03-21 NOTE — Discharge Instructions (Signed)
Your pneumonia improved on today's xray. Continue tessalon. Take prednisone as prescribed until all gone for inflammation. Take robitussin DM for cough suppression. Follow up with your doctor if not improving. Return if worsening.

## 2016-03-21 NOTE — ED Notes (Signed)
ED Provider at bedside. 

## 2016-03-21 NOTE — ED Triage Notes (Signed)
Pt states she was seen here Saturday-dx with PNE-completed abx and feels no better-NAD-steady gait

## 2017-05-14 ENCOUNTER — Emergency Department (HOSPITAL_BASED_OUTPATIENT_CLINIC_OR_DEPARTMENT_OTHER)
Admission: EM | Admit: 2017-05-14 | Discharge: 2017-05-14 | Disposition: A | Discharge status: Home / Self Care | Payer: Self-pay | Attending: Emergency Medicine | Admitting: Emergency Medicine

## 2017-05-14 ENCOUNTER — Encounter (HOSPITAL_BASED_OUTPATIENT_CLINIC_OR_DEPARTMENT_OTHER): Payer: Self-pay | Admitting: Emergency Medicine

## 2017-05-14 ENCOUNTER — Other Ambulatory Visit: Payer: Self-pay

## 2017-05-14 DIAGNOSIS — J020 Streptococcal pharyngitis: Secondary | ICD-10-CM | POA: Insufficient documentation

## 2017-05-14 DIAGNOSIS — Z79899 Other long term (current) drug therapy: Secondary | ICD-10-CM | POA: Insufficient documentation

## 2017-05-14 LAB — RAPID STREP SCREEN (MED CTR MEBANE ONLY): Streptococcus, Group A Screen (Direct): POSITIVE — AB

## 2017-05-14 MED ORDER — PENICILLIN V POTASSIUM 500 MG PO TABS: 500.0000 mg | ORAL_TABLET | Freq: Four times a day (QID) | ORAL | 0 refills | Status: AC

## 2017-05-14 MED ORDER — PREDNISONE 50 MG PO TABS
60.0000 mg | ORAL_TABLET | Freq: Once | ORAL | Status: AC
  Administered 2017-05-14: 60 mg via ORAL
  Filled 2017-05-14: qty 1

## 2017-05-14 MED ORDER — IBUPROFEN 800 MG PO TABS
800.0000 mg | ORAL_TABLET | Freq: Once | ORAL | Status: AC
Start: 2017-05-14 — End: 2017-05-14
  Administered 2017-05-14: 800 mg via ORAL
  Filled 2017-05-14: qty 1

## 2017-05-14 MED ORDER — PENICILLIN V POTASSIUM 250 MG PO TABS
500.0000 mg | ORAL_TABLET | Freq: Once | ORAL | Status: AC
  Administered 2017-05-14: 500 mg via ORAL
  Filled 2017-05-14: qty 2

## 2017-05-14 MED FILL — PENICILLIN VK 500 MG TABLET: 500 | 10 days supply | Qty: 40 | Fill #0

## 2017-05-14 NOTE — Discharge Instructions (Signed)
You were evaluated in the emergency department for a sore throat and were positive for strep.  We are prescribing you penicillin to take 4 times a day for 10 days.  He should continue Tylenol and ibuprofen for pain and fever control, to keep well hydrated, and try warm salt water gargles to help with the pain.  Please return to the hospital if any worsening symptoms.

## 2017-05-14 NOTE — ED Provider Notes (Signed)
[facility] EMERGENCY DEPARTMENT Provider Note   CSN: [id number] Arrival date & time: 05/14/17  1032     History   Chief Complaint Chief Complaint  Patient presents with  . Sore Throat  . Fever    HPI Cynthia Daniels is a 27 y.o. female.  The history is provided by the patient.  Sore Throat  This is a new problem. The current episode started yesterday. The problem occurs constantly. The problem has not changed since onset.Pertinent negatives include no chest pain, no abdominal pain, no headaches and no shortness of breath. The symptoms are aggravated by swallowing. The symptoms are relieved by acetaminophen. She has tried acetaminophen for the symptoms. The treatment provided mild relief.  Fever   Associated symptoms include cough. Pertinent negatives include no chest pain, no headaches and no sore throat.    27 year old female with no significant past medical history complaining of illness for 2 days.  Yesterday she started with body aches and a sore throat, which have continued today associated with a fever.  She is got some sinus congestion and pain in her ears.  Spine nonproductive cough.  She is taking TheraFlu with some relief.  Last menstrual period was 2 weeks ago normal time.  There is no associated nausea vomiting diarrhea or urine no rash.  Did not get a flu shot this year.  Past Medical History:  Diagnosis Date  . Late prenatal care   . Medical history non-contributory   . No pertinent past medical history   . Pneumonia     Patient Active Problem List   Diagnosis Date Noted  . Vaginal delivery 05/26/2011    Past Surgical History:  Procedure Laterality Date  . NO PAST SURGERIES      OB History    Gravida Para Term Preterm AB Living   2 1 1  0 1 1   SAB TAB Ectopic Multiple Live Births   1 0 0 0 1       Home Medications    Prior to Admission medications   Medication Sig Start Date End Date Taking? Authorizing Provider  acetaminophen  (TYLENOL) 500 MG tablet Take 1,000 mg by mouth every 6 (six) hours as needed for mild pain.    [provider]  benzonatate (TESSALON) 100 MG capsule Take 1 capsule (100 mg total) by mouth every 8 (eight) hours. 03/17/16   Renne Crigler, PA-C  guaiFENesin-dextromethorphan (ROBITUSSIN DM) 100-10 MG/5ML syrup Take 5 mLs by mouth every 4 (four) hours as needed for cough. 03/21/16   Kirichenko, Tatyana, PA-C  predniSONE (DELTASONE) 20 MG tablet Take 2 tablets (40 mg total) by mouth daily. 03/21/16   Jaynie Crumble, PA-C  PRESCRIPTION MEDICATION Birth Control Pills    [provider]    Family History Family History  Problem Relation Age of Onset  . Asthma Mother   . Depression Mother   . Asthma Maternal Grandmother   . Anesthesia problems Neg Hx   . Hypotension Neg Hx   . Malignant hyperthermia Neg Hx   . Pseudochol deficiency Neg Hx     Social History Social History   Tobacco Use  . Smoking status: Never Smoker  . Smokeless tobacco: Never Used  Substance Use Topics  . Alcohol use: Yes  . Drug use: No     Allergies   Patient has no known allergies.   Review of Systems Review of Systems  Constitutional: Positive for fever.  HENT: Positive for ear pain, nosebleeds, rhinorrhea and sinus  pressure. Negative for dental problem, ear discharge, facial swelling, sore throat, trouble swallowing and voice change.   Eyes: Negative for pain.  Respiratory: Positive for cough. Negative for shortness of breath.   Cardiovascular: Negative for chest pain.  Gastrointestinal: Negative for abdominal pain.  Genitourinary: Negative for dysuria.  Skin: Negative for rash.  Neurological: Negative for headaches.     Physical Exam Updated Vital Signs BP (!) 125/92 (BP Location: Right Arm)   Pulse (!) 117   Temp 100 F (37.8 C) (Oral)   Resp 18   Ht 5\' 5"  (1.651 m)   Wt 46.7 kg (103 lb)   LMP 04/30/2017   SpO2 100%   BMI 17.14 kg/m   Physical Exam    Constitutional: She appears well-developed and well-nourished.  HENT:  Head: Normocephalic and atraumatic.  Right Ear: Tympanic membrane normal. No drainage.  Left Ear: Tympanic membrane normal. No drainage.  Mouth/Throat: Uvula is midline and mucous membranes are normal. No uvula swelling. Posterior oropharyngeal erythema present. No oropharyngeal exudate, posterior oropharyngeal edema or tonsillar abscesses.  Eyes: Conjunctivae and EOM are normal. Pupils are equal, round, and reactive to light.  Neck: Neck supple.  Cardiovascular: Tachycardia present.  Lymphadenopathy:    She has cervical adenopathy.  Neurological: She is alert. GCS eye subscore is 4. GCS verbal subscore is 5. GCS motor subscore is 6.  Skin: Skin is warm and dry.  Psychiatric: She has a normal mood and affect.     ED Treatments / Results  Labs (all labs ordered are listed, but only abnormal results are displayed) Labs Reviewed  RAPID STREP SCREEN (NOT AT [facility]) - Abnormal; Notable for the following components:      Result Value   Streptococcus, Group A Screen (Direct) POSITIVE (*)    All other components within normal limits    EKG  EKG Interpretation None       Radiology No results found.  Procedures Procedures (including critical care time)  Medications Ordered in ED Medications - No data to display   Initial Impression / Assessment and Plan / ED Course  I have reviewed the triage vital signs and the nursing notes.  Pertinent labs & imaging results that were available during my care of the patient were reviewed by me and considered in my medical decision making (see chart for details).     Patient given prednisone, ibuprofen and pen VK. tollerating po.   Final Clinical Impressions(s) / ED Diagnoses   Final diagnoses:  Strep pharyngitis    ED Discharge Orders        Ordered    penicillin v potassium (VEETID) 500 MG tablet  4 times daily     05/14/17 1131       Terrilee FilesButler, Gentry Pilson C,  MD 05/15/17 1215

## 2017-05-14 NOTE — ED Notes (Signed)
ED Provider at bedside. 

## 2017-05-14 NOTE — ED Triage Notes (Signed)
Fever and sore throat since yesterday. Last tylenol at 8 this morning.

## 2018-04-23 NOTE — L&D Delivery Note (Signed)
Delivery Note Patient pushed for approximately 30 minutes after she was noted to be C/C/+2.  At 2:24 PM a viable and healthy female was delivered via Vaginal, Spontaneous (Presentation:ROA) through a body cord. Shoulders and body were easily delivered over an intact perineum.   APGAR: 8, 9; weight pending.  Baby was laid on maternal abdomen.  Delayed cord clamping done and cord cut by father.  Placenta spontaneously delivered intact, 3 vessels noted.  Uterine atony alleviated by IV pitocin and massage  Bilateral labial lacerations repaired with one interrupted suture of 2-0 chromic on SH needle.  Patient tolerated delivery well. No complications.   Anesthesia:   Episiotomy: None Lacerations: Labial Suture Repair: 2.0 chromic Est. Blood Loss (mL): 200  Mom to postpartum.  Baby to Couplet care / Skin to Skin.  Evans, [place] 12/17/2018, 2:42 PM

## 2018-11-29 ENCOUNTER — Inpatient Hospital Stay (HOSPITAL_COMMUNITY)
Admission: EM | Admit: 2018-11-29 | Discharge: 2018-12-01 | DRG: 833 | Disposition: A | Discharge status: Home / Self Care | Payer: BC Managed Care – PPO | Attending: Obstetrics and Gynecology | Admitting: Obstetrics and Gynecology

## 2018-11-29 ENCOUNTER — Other Ambulatory Visit: Payer: Self-pay

## 2018-11-29 ENCOUNTER — Inpatient Hospital Stay (HOSPITAL_COMMUNITY): Payer: BC Managed Care – PPO

## 2018-11-29 ENCOUNTER — Encounter (HOSPITAL_COMMUNITY): Payer: Self-pay

## 2018-11-29 DIAGNOSIS — O4693 Antepartum hemorrhage, unspecified, third trimester: Secondary | ICD-10-CM

## 2018-11-29 DIAGNOSIS — O0933 Supervision of pregnancy with insufficient antenatal care, third trimester: Secondary | ICD-10-CM

## 2018-11-29 DIAGNOSIS — Z20828 Contact with and (suspected) exposure to other viral communicable diseases: Secondary | ICD-10-CM | POA: Diagnosis present

## 2018-11-29 DIAGNOSIS — Z3A35 35 weeks gestation of pregnancy: Secondary | ICD-10-CM

## 2018-11-29 DIAGNOSIS — N939 Abnormal uterine and vaginal bleeding, unspecified: Secondary | ICD-10-CM | POA: Diagnosis present

## 2018-11-29 LAB — URINALYSIS, ROUTINE W REFLEX MICROSCOPIC
Bacteria, UA: NONE SEEN
Bilirubin Urine: NEGATIVE
Glucose, UA: NEGATIVE mg/dL
Ketones, ur: NEGATIVE mg/dL
Leukocytes,Ua: NEGATIVE
Nitrite: NEGATIVE
Protein, ur: NEGATIVE mg/dL
RBC / HPF: >50 RBC/hpf — ABNORMAL HIGH (ref 0–5)
Specific Gravity, Urine: 1.006 (ref 1.005–1.030)
pH: 9 — ABNORMAL HIGH (ref 5.0–8.0)

## 2018-11-29 LAB — RAPID URINE DRUG SCREEN, HOSP PERFORMED
Amphetamines: NOT DETECTED
Barbiturates: NOT DETECTED
Benzodiazepines: NOT DETECTED
Cocaine: NOT DETECTED
Opiates: NOT DETECTED
Tetrahydrocannabinol: NOT DETECTED

## 2018-11-29 LAB — CBC
HCT: 34.4 % — ABNORMAL LOW (ref 36.0–46.0)
Hemoglobin: 11.7 g/dL — ABNORMAL LOW (ref 12.0–15.0)
MCH: 32.1 pg (ref 26.0–34.0)
MCHC: 34 g/dL (ref 30.0–36.0)
MCV: 94.5 fL (ref 80.0–100.0)
Platelets: 207 10*3/uL (ref 150–400)
RBC: 3.64 MIL/uL — ABNORMAL LOW (ref 3.87–5.11)
RDW: 13 % (ref 11.5–15.5)
WBC: 15.2 10*3/uL — ABNORMAL HIGH (ref 4.0–10.5)
nRBC: 0 % (ref 0.0–0.2)

## 2018-11-29 LAB — SARS CORONAVIRUS 2 BY RT PCR (HOSPITAL ORDER, PERFORMED IN ~~LOC~~ HOSPITAL LAB): SARS Coronavirus 2: NEGATIVE

## 2018-11-29 LAB — GROUP B STREP BY PCR: Group B strep by PCR: NEGATIVE

## 2018-11-29 MED ORDER — LACTATED RINGERS IV BOLUS
500.0000 mL | Freq: Once | INTRAVENOUS | Status: AC
Start: 2018-11-29 — End: 2018-11-29
  Administered 2018-11-29: 21:00:00 500 mL via INTRAVENOUS

## 2018-11-29 MED ORDER — ZOLPIDEM TARTRATE 5 MG PO TABS: 5.0000 mg | ORAL_TABLET | Freq: Every evening | ORAL | Status: DC | PRN

## 2018-11-29 MED ORDER — CALCIUM CARBONATE ANTACID 500 MG PO CHEW
2.0000 | CHEWABLE_TABLET | ORAL | Status: DC | PRN
  Administered 2018-11-30 (×2): 400 mg via ORAL
  Filled 2018-11-29 (×2): qty 2

## 2018-11-29 MED ORDER — DOCUSATE SODIUM 100 MG PO CAPS
100.0000 mg | ORAL_CAPSULE | Freq: Every day | ORAL | Status: DC
  Administered 2018-11-30 – 2018-12-01 (×2): 100 mg via ORAL
  Filled 2018-11-29 (×2): qty 1

## 2018-11-29 MED ORDER — ACETAMINOPHEN 325 MG PO TABS
650.0000 mg | ORAL_TABLET | ORAL | Status: DC | PRN
  Administered 2018-11-30 – 2018-12-01 (×2): 650 mg via ORAL
  Filled 2018-11-29 (×2): qty 2

## 2018-11-29 MED ORDER — PRENATAL MULTIVITAMIN CH
1.0000 | ORAL_TABLET | Freq: Every day | ORAL | Status: DC
  Administered 2018-11-30 – 2018-12-01 (×2): 1 via ORAL
  Filled 2018-11-29 (×3): qty 1

## 2018-11-29 MED ORDER — BETAMETHASONE SOD PHOS & ACET 6 (3-3) MG/ML IJ SUSP
12.0000 mg | INTRAMUSCULAR | Status: AC
  Administered 2018-11-29 – 2018-11-30 (×2): 12 mg via INTRAMUSCULAR
  Filled 2018-11-29 (×2): qty 2

## 2018-11-29 NOTE — MAU Provider Note (Addendum)
History     CSN: [id number]  Arrival date and time: 11/29/18 1543   First Provider Initiated Contact with Patient 11/29/18 1637      Chief Complaint  Patient presents with  . Vaginal Bleeding   HPI Ms. Cynthia Daniels is a 28 y.o. G3P1011 at 456w4d who presents to MAU today with complaint of new onset bright red vaginal bleeding this afternoon. She denies any complications with this pregnancy. She has had serial growth US in this pregnancy due to a history of oligohydramnios with a previous pregnancy. US has been normal and prenatal records indicate posterior placenta. She states she got out of the shower and noted a small amount of blood on the tissue when she wiped after using the bathroom. She stood up and had a large gush of blood. She put on a pad and come here. Upon arrival she noted a large clot and moderate blood on the pad. She denies contractions, abdominal pain or LOF. She states occasional very mild tightening noted like BH contractions, per patient. She reports normal fetal movement.   OB History    Gravida  3   Para  1   Term  1   Preterm  0   AB  1   Living  1     SAB  1   TAB  0   Ectopic  0   Multiple  0   Live Births  1           Past Medical History:  Diagnosis Date  . Late prenatal care   . Medical history non-contributory   . No pertinent past medical history   . Pneumonia     Past Surgical History:  Procedure Laterality Date  . NO PAST SURGERIES      Family History  Problem Relation Age of Onset  . Asthma Mother   . Depression Mother   . Asthma Maternal Grandmother   . Anesthesia problems Neg Hx   . Hypotension Neg Hx   . Malignant hyperthermia Neg Hx   . Pseudochol deficiency Neg Hx     Social History   Tobacco Use  . Smoking status: Never Smoker  . Smokeless tobacco: Never Used  Substance Use Topics  . Alcohol use: Yes  . Drug use: No    Allergies: No Known Allergies  Medications Prior to Admission  Medication Sig  Dispense Refill Last Dose  . acetaminophen (TYLENOL) 500 MG tablet Take 1,000 mg by mouth every 6 (six) hours as needed for mild pain.     . benzonatate (TESSALON) 100 MG capsule Take 1 capsule (100 mg total) by mouth every 8 (eight) hours. 15 capsule 0   . guaiFENesin-dextromethorphan (ROBITUSSIN DM) 100-10 MG/5ML syrup Take 5 mLs by mouth every 4 (four) hours as needed for cough. 118 mL 0   . predniSONE (DELTASONE) 20 MG tablet Take 2 tablets (40 mg total) by mouth daily. 10 tablet 0   . PRESCRIPTION MEDICATION Birth Control Pills       Review of Systems  Constitutional: Negative for fever.  Gastrointestinal: Positive for constipation. Negative for abdominal pain, diarrhea, nausea and vomiting.  Genitourinary: Positive for vaginal bleeding. Negative for dysuria, frequency, urgency and vaginal discharge.   Physical Exam   Blood pressure 116/76, pulse (!) 108, temperature 98.6 F (37 C), temperature source Oral, resp. rate 16, height 5\' 5"  (1.651 m), weight 73.8 kg, SpO2 99 %, unknown if currently breastfeeding.  Physical Exam  Nursing note and vitals reviewed.  Constitutional: She is oriented to person, place, and time. She appears well-developed and well-nourished. No distress.  HENT:  Head: Normocephalic and atraumatic.  Cardiovascular: Normal rate.  Respiratory: Effort normal.  GI: Soft. She exhibits no distension and no mass. There is no abdominal tenderness. There is no rebound and no guarding.  Genitourinary: Uterus is enlarged. Cervix exhibits no motion tenderness and no friability.    Vaginal bleeding (small) present.  There is bleeding (small) in the vagina.  Neurological: She is alert and oriented to person, place, and time.  Skin: Skin is warm and dry. No erythema.  Psychiatric: She has a normal mood and affect.   Cervical Exam:  Dilation: 3 Effacement (%): 50 Cervical Position: Middle Presentation: Vertex Exam by:: Fredda Hammed RN   No results found for this or  any previous visit (from the past 24 hour(s)).  Fetal Monitoring: Baseline: 140 bpm Variability: moderate Accelerations: 15 x 15 Decelerations: none Contractions: q 3-4 minutes, mild, non-palpable  MAU Course  Procedures None  MDM Limited OB US ordered due to bleeding 1654 - Care turned over to Earlie Server, NP. Patient waiting to go to [place].  Ultrasound done and no problems reported on preliminary [place] report. Continues to have contractions - cervical reexam 3/70%/-2 but ballotable - continues to have contractions and small amount of vaginal bleeding on exam and in toilet with urination. Consult with Dr. Kennon Rounds and would recommend admission. Called [name], CNM with recommendation for admission - Covid test ordered. Fetal Monitoring -  Baseline - 135 BPM Variability - Moderate Accelerations: 15x15 Decelerations - none Contractions 2-5 min, mild  Kerry Hough, PA-C 11/29/2018, 4:54 PM  Assessment and Plan  Bleeding at [redacted]w[redacted]d Admission    Earlie Server, RN, MSN, NP-BC Nurse Practitioner, [facility] for [employer], [place] 11/29/2018 8:20 PM

## 2018-11-29 NOTE — H&P (Signed)
Cynthia Daniels is a 28 y.o. female presenting for bright bleeding today.  Was evaluated with MAU and noted to be contracting and dilated to 3 cm.    OB History    Gravida  3   Para  1   Term  1   Preterm  0   AB  1   Living  1     SAB  1   TAB  0   Ectopic  0   Multiple  0   Live Births  1          Past Medical History:  Diagnosis Date  . Late prenatal care   . Medical history non-contributory   . No pertinent past medical history   . Pneumonia    Past Surgical History:  Procedure Laterality Date  . NO PAST SURGERIES     Family History: family history includes Asthma in her maternal grandmother and mother; Depression in her mother. Social History:  reports that she has never smoked. She has never used smokeless tobacco. She reports current alcohol use. She reports that she does not use drugs.  Denies smoking or drug use. FM. Hx of RH negative had Rhogam at 28 weeks     Maternal Diabetes: No Genetic Screening: Normal Maternal Ultrasounds/Referrals: Normal Fetal Ultrasounds or other Referrals:  None Maternal Substance Abuse:  No Significant Maternal Medications:  None Significant Maternal Lab Results:  Rh negative Other Comments:  None  Review of Systems  Constitutional: Negative.   HENT: Negative.   Eyes: Negative.   Respiratory: Negative.   Cardiovascular: Negative.   Gastrointestinal: Positive for abdominal pain.  Genitourinary: Negative.        Vaginal bleeding  Musculoskeletal: Negative.   Skin: Negative.   Neurological: Negative.   Endo/Heme/Allergies: Negative.   Psychiatric/Behavioral: Negative.    History Dilation: 3 Effacement (%): 70 Station: Ballotable, -2 Exam by:: Lauren Cox RN  Blood pressure 116/76, pulse (!) 108, temperature 98.6 F (37 C), temperature source Oral, resp. rate 16, height 5\' 5"  (1.651 m), weight 73.8 kg, SpO2 99 %, unknown if currently breastfeeding. Maternal Exam:  Uterine Assessment: Contraction strength is  mild.  Contraction frequency is rare.   Abdomen: Patient reports no abdominal tenderness. Fetal presentation: vertex  Introitus: Normal vulva. Normal vagina.  Ferning test: not done.  Nitrazine test: not done. Amniotic fluid character: not assessed.  Pelvis: adequate for delivery.   Cervix: Cervix evaluated by digital exam.     Physical Exam  Constitutional: She is oriented to person, place, and time. She appears well-developed and well-nourished.  HENT:  Head: Normocephalic.  Neck: Normal range of motion.  Cardiovascular: Normal rate.  Respiratory: Effort normal.  GI: Soft.  Genitourinary:    Vulva and vagina normal.   Musculoskeletal: Normal range of motion.  Neurological: She is alert and oriented to person, place, and time.  Skin: Skin is warm and dry.  Psychiatric: She has a normal mood and affect.    Prenatal labs: ABO, Rh:  A neg Antibody:  NR Rubella:  IMM RPR:   NR HBsAg:   NR HIV:   Neg GBS:   unknown  Assessment/Plan: 35.4 IUP with bright red bleeding and contractions Cat 1 strip P: Observation for 24 hours  Betamethasone x2 GBS PCR Continuous monitoring IV bolus Pleas Koch Prothero 11/29/2018, 8:28 PM

## 2018-11-29 NOTE — MAU Note (Signed)
Cynthia Daniels is a 28 y.o. at 106w4d here in MAU reporting: saw some bleeding today after taking a shower, went to the toilet and saw a big gush. States she is feeling the bleeding since then. Saw a few small clots. No recent IC. No pain. +FM. Denies complications with pregnancy.  Onset of complaint: today, within the past hour  Pain score: 0/10  Vitals:   11/29/18 1615  BP: 116/76  Pulse: (!) 108  Resp: 16  Temp: 98.6 F (37 C)  SpO2: 99%     FHT: +FM  Lab orders placed from triage: UA

## 2018-11-30 DIAGNOSIS — N939 Abnormal uterine and vaginal bleeding, unspecified: Secondary | ICD-10-CM | POA: Diagnosis present

## 2018-11-30 LAB — RPR: RPR Ser Ql: NONREACTIVE

## 2018-11-30 NOTE — Progress Notes (Addendum)
Cynthia Daniels is a 28 y.o. G3P1011 at [redacted]w[redacted]d admitted for vaginal bleeding LOS# 1  Subjective: Pt comfortable.  She reports she did not feel contractions on admission but now she can.  Bleeding has slowed, endorses only spotting when wipes. Pt was able to get sleep last night and up to date of POC, pt endorses wanting to stay the night to ensure no further advance dilation occurs. Pt denies vaginal leakage at this time. Husband supportive and at bedside.  Objective: BP (!) 104/59 (BP Location: Right Arm)   Pulse 84   Temp 98.2 F (36.8 C) (Oral)   Resp 16   Ht 5\' 5"  (1.651 m)   Wt 73.8 kg   SpO2 96%   BMI 27.06 kg/m  No intake/output data recorded. No intake/output data recorded.  Gen:  NAD, comfortable Abd:  Nontender, gravid Ext:  No edema, no calf tenderness  FHT:  FHR: 115-120s bpm, variability: moderate,  accelerations:  Present,  decelerations:  Absent UC:   irregular, every 3-8 minutes before 11am this morning then cxt tapered off SVE:   Dilation: 3 Effacement (%): 50 Station: -2 Exam by:: [name]: Lab Results  Component Value Date   WBC 15.2 (H) 11/29/2018   HGB 11.7 (L) 11/29/2018   HCT 34.4 (L) 11/29/2018   MCV 94.5 11/29/2018   PLT 207 11/29/2018    Assessment / Plan: IUP @ 35 5/7 weeks LOS# 1 Vaginal bleeding-Etiology unknown  Decreasing. Labor: Contractions still present and now pt is feeling them @ 11am, after 12 cxt stopped, pt then declined feeling them.  No cervical change noted. Only spotting, appears brown after vaginal exam.  Fetal Wellbeing:  Category I, Reactive NST, Betamethasone due to possible PTL, second dose due at 1900 tonight. I/D:  No signs of infection.   Work note given to husband for work.   [facility] FNP, [place]  @ 11:00am 11/30/18  [name] 11/30/2018, 10:36 AM -

## 2018-12-01 NOTE — Progress Notes (Signed)
Cynthia Daniels is a 28 y.o. G3P1011 at [redacted]w[redacted]d admitted for vaginal bleeding LOS# 2  Subjective: Pt comfortable.  She reports cxt stopp over 12 hours ago, yesterday around lunch, no bleeding occurred either, but x1 hour ago pt had mild tolerable cramp, irregular, pt worried about going into labor when she gets home. Pt denies vaginal leakage at this time. Husband supportive and at bedside.  Objective: BP (!) 106/58 (BP Location: Right Arm)   Pulse 92   Temp 98.4 F (36.9 C) (Oral)   Resp 17   Ht 5\' 5"  (1.651 m)   Wt 73.8 kg   SpO2 100%   BMI 27.06 kg/m  No intake/output data recorded. No intake/output data recorded.  Gen:  NAD, comfortable Abd:  Nontender, gravid Ext:  No edema, no calf tenderness  FHT:  FHR: 120s bpm, variability: moderate,  accelerations:  Present,  decelerations:  Absent UC:   None  Last check was 8/9 @ 1100am SVE:   Dilation: 3 Effacement (%): 50 Station: -2 Exam by:: [name]: Lab Results  Component Value Date   WBC 15.2 (H) 11/29/2018   HGB 11.7 (L) 11/29/2018   HCT 34.4 (L) 11/29/2018   MCV 94.5 11/29/2018   PLT 207 11/29/2018    Assessment / Plan: IUP @ 35 5/7 weeks LOS# 2 Vaginal bleeding-Etiology unknown: Resolved x24hours. Labor: Contractions resolved after 12pm yesterday, pt then declined feeling them. Pt started feeling cxt again x1 hour ago, unable to palpate them.  No cervical change noted.  Fetal Wellbeing:  Category I, Reactive NST, Betamethasone given second dose last night I/D:  No signs of infection.    I am under the impression pt may be discharge to home today, will report to Dr Mancel Bale.   Dr Mancel Bale to assume care of pt and report given at 0700.  [place], [place],  12/01/2018, 6:48 AM -

## 2018-12-01 NOTE — Discharge Instructions (Signed)
Vaginal Bleeding During Pregnancy, Third Trimester ° °A small amount of bleeding from the vagina (spotting) is relatively common during pregnancy. Various things can cause bleeding or spotting during pregnancy. Sometimes bleeding is normal and is not a problem. However, bleeding during the third trimester can also be a sign of something serious for the mother and the baby. Be sure to tell your health care provider about any vaginal bleeding right away. °Some possible causes of vaginal bleeding during the third trimester include: °· Infection or growths (polyps) on the cervix. °· A condition in which the placenta partially or completely covers the opening of the cervix inside the uterus (placenta previa). °· The placenta separating from the uterus (placenta abruption). °· The start of labor (discharging of the mucus plug). °· A condition in which the placenta grows into the muscle layer of the uterus (placenta accreta). °Follow these instructions at home: °Activity °· Follow instructions from your health care provider about limiting your activity. If your health care provider recommends activity restriction, you may need to stay in bed and only get up to use the bathroom. In some cases, your health care provider may allow you to continue light activity. °· If needed, make plans for someone to help with your regular activities. °· Ask your health care provider if it is safe for you to drive. °· Do not lift anything that is heavier than 10 lb (4.5 kg), or the limit that your health care provider tells you, until he or she says that it is safe. °· Do not have sex or orgasms until your health care provider says that this is safe. °Medicines °· Take over-the-counter and prescription medicines only as told by your health care provider. °· Do not take aspirin because it can cause bleeding. °General instructions °· Pay attention to any changes in your symptoms. °· Write down how many pads you use each day, how often you  change pads, and how soaked (saturated) they are. °· Do not use tampons or douche. °· If you pass any tissue from your vagina, save the tissue so you can show it to your health care provider. °· Keep all follow-up visits as told by your health care provider. This is important. °Contact a health care provider if: °· You have vaginal bleeding during any part of your pregnancy. °· You have cramps or labor pains. °· You have a fever. °Get help right away if: °· You have severe cramps or pain in your back or abdomen. °· You have a gush of fluid from the vagina. °· You pass large clots or a large amount of tissue from your vagina. °· Your bleeding increases. °· You feel light-headed or weak. °· You faint. °· You feel that your baby is moving less than usual, or not moving at all. °Summary °· Various things can cause bleeding or spotting in pregnancy. °· Bleeding during the third trimester can be a sign of a serious problem for the mother and the baby. °· Be sure to tell your health care provider about any vaginal bleeding right away. °This information is not intended to replace advice given to you by your health care provider. Make sure you discuss any questions you have with your health care provider. °Document Released: 06/30/2002 Document Revised: 07/29/2018 Document Reviewed: 07/12/2016 °Elsevier Patient Education © 2020 [employer] ° °

## 2018-12-01 NOTE — Discharge Summary (Signed)
Physician Discharge Summary  Patient ID: Cynthia Daniels MRN: [id number] DOB/AGE: [birth year] 28 y.o.  Admit date: 11/29/2018 Discharge date: 12/01/2018  Admission Diagnoses: 35 4/7wks Third Trimester Bleeding likely due to PTL/Ctxs  Discharge Diagnoses:  Active Problems:   Vaginal bleeding in pregnancy, third trimester   Vaginal bleeding   Discharged Condition: good  Hospital Course: Pt observed and given betamethasone course.  No cervical change during admission.  Precautions given at discharge including but not limited to call if decreased FM, VB, regular CTXs or LOF.  Pt is agreeable and questions answered.  Consults: None  Significant Diagnostic Studies: ultrasound upon admission  Treatments: IV hydration and steroids: betamethasone  Discharge Exam: Blood pressure 117/66, pulse (!) 101, temperature 98.2 F (36.8 C), temperature source Oral, resp. rate 16, height 5\' 5"  (1.651 m), weight 73.8 kg, SpO2 100 %, unknown if currently breastfeeding. General appearance: alert and no distress Resp: clear to auscultation bilaterally Cardio: regular rate and rhythm Extremities: Homans sign is negative, no sign of DVT Cvx 2/75/0  Disposition: Discharge disposition: 01-Home or Self Care        Allergies as of 12/01/2018   No Known Allergies     Medication List    STOP taking these medications   predniSONE 20 MG tablet Commonly known as: DELTASONE     TAKE these medications   acetaminophen 500 MG tablet Commonly known as: TYLENOL Take 1,000 mg by mouth every 6 (six) hours as needed for mild pain.   benzonatate 100 MG capsule Commonly known as: TESSALON Take 1 capsule (100 mg total) by mouth every 8 (eight) hours.   guaiFENesin-dextromethorphan 100-10 MG/5ML syrup Commonly known as: ROBITUSSIN DM Take 5 mLs by mouth every 4 (four) hours as needed for cough.   PRESCRIPTION MEDICATION Birth Control Pills        Signed: Delice Lesch 12/01/2018, 4:49 PM

## 2018-12-02 LAB — BPAM RBC
Blood Product Expiration Date: 202009012359
Blood Product Expiration Date: 202009012359
Unit Type and Rh: 600
Unit Type and Rh: 600

## 2018-12-02 LAB — TYPE AND SCREEN
ABO/RH(D): A NEG
Antibody Screen: POSITIVE
Unit division: 0
Unit division: 0

## 2018-12-02 LAB — URINE CULTURE

## 2018-12-17 ENCOUNTER — Encounter (HOSPITAL_COMMUNITY): Payer: Self-pay | Admitting: *Deleted

## 2018-12-17 ENCOUNTER — Inpatient Hospital Stay (HOSPITAL_COMMUNITY)
Admission: AD | Admit: 2018-12-17 | Discharge: 2018-12-19 | DRG: 798 | Disposition: A | Discharge status: Home / Self Care | Payer: BC Managed Care – PPO | Attending: Obstetrics & Gynecology | Admitting: Obstetrics & Gynecology

## 2018-12-17 ENCOUNTER — Other Ambulatory Visit: Payer: Self-pay

## 2018-12-17 DIAGNOSIS — Z20828 Contact with and (suspected) exposure to other viral communicable diseases: Secondary | ICD-10-CM | POA: Diagnosis present

## 2018-12-17 DIAGNOSIS — O26893 Other specified pregnancy related conditions, third trimester: Secondary | ICD-10-CM | POA: Diagnosis present

## 2018-12-17 DIAGNOSIS — Z302 Encounter for sterilization: Secondary | ICD-10-CM | POA: Diagnosis not present

## 2018-12-17 DIAGNOSIS — Z6791 Unspecified blood type, Rh negative: Secondary | ICD-10-CM

## 2018-12-17 DIAGNOSIS — Z3A38 38 weeks gestation of pregnancy: Secondary | ICD-10-CM | POA: Diagnosis not present

## 2018-12-17 LAB — RPR: RPR Ser Ql: NONREACTIVE

## 2018-12-17 LAB — CBC
HCT: 34.6 % — ABNORMAL LOW (ref 36.0–46.0)
Hemoglobin: 11.7 g/dL — ABNORMAL LOW (ref 12.0–15.0)
MCH: 32.3 pg (ref 26.0–34.0)
MCHC: 33.8 g/dL (ref 30.0–36.0)
MCV: 95.6 fL (ref 80.0–100.0)
Platelets: 190 10*3/uL (ref 150–400)
RBC: 3.62 MIL/uL — ABNORMAL LOW (ref 3.87–5.11)
RDW: 13 % (ref 11.5–15.5)
WBC: 12.2 10*3/uL — ABNORMAL HIGH (ref 4.0–10.5)
nRBC: 0 % (ref 0.0–0.2)

## 2018-12-17 LAB — SARS CORONAVIRUS 2 (TAT 6-24 HRS): SARS Coronavirus 2: NEGATIVE

## 2018-12-17 MED ORDER — SOD CITRATE-CITRIC ACID 500-334 MG/5ML PO SOLN: 30.0000 mL | ORAL | Status: DC | PRN

## 2018-12-17 MED ORDER — SENNOSIDES-DOCUSATE SODIUM 8.6-50 MG PO TABS
2.0000 | ORAL_TABLET | ORAL | Status: DC
  Administered 2018-12-17 – 2018-12-19 (×2): 2 via ORAL
  Filled 2018-12-17 (×2): qty 2

## 2018-12-17 MED ORDER — BENZOCAINE-MENTHOL 20-0.5 % EX AERO: 1.0000 "application " | INHALATION_SPRAY | CUTANEOUS | Status: DC | PRN

## 2018-12-17 MED ORDER — OXYTOCIN 40 UNITS IN NORMAL SALINE INFUSION - SIMPLE MED
2.5000 [IU]/h | INTRAVENOUS | Status: DC
  Filled 2018-12-17: qty 1000

## 2018-12-17 MED ORDER — ZOLPIDEM TARTRATE 5 MG PO TABS: 5.0000 mg | ORAL_TABLET | Freq: Every evening | ORAL | Status: DC | PRN

## 2018-12-17 MED ORDER — DIPHENHYDRAMINE HCL 25 MG PO CAPS: 25.0000 mg | ORAL_CAPSULE | Freq: Four times a day (QID) | ORAL | Status: DC | PRN

## 2018-12-17 MED ORDER — OXYTOCIN BOLUS FROM INFUSION
500.0000 mL | Freq: Once | INTRAVENOUS | Status: AC
  Administered 2018-12-17: 500 mL via INTRAVENOUS

## 2018-12-17 MED ORDER — ONDANSETRON HCL 4 MG/2ML IJ SOLN: 4.0000 mg | INTRAMUSCULAR | Status: DC | PRN

## 2018-12-17 MED ORDER — ONDANSETRON HCL 4 MG PO TABS: 4.0000 mg | ORAL_TABLET | ORAL | Status: DC | PRN

## 2018-12-17 MED ORDER — LORATADINE 10 MG PO TABS: 10.0000 mg | ORAL_TABLET | Freq: Every day | ORAL | Status: DC

## 2018-12-17 MED ORDER — OXYCODONE-ACETAMINOPHEN 5-325 MG PO TABS: 2.0000 | ORAL_TABLET | ORAL | Status: DC | PRN

## 2018-12-17 MED ORDER — ACETAMINOPHEN 325 MG PO TABS: 650.0000 mg | ORAL_TABLET | ORAL | Status: DC | PRN

## 2018-12-17 MED ORDER — FLEET ENEMA 7-19 GM/118ML RE ENEM: 1.0000 | ENEMA | RECTAL | Status: DC | PRN

## 2018-12-17 MED ORDER — PRENATAL MULTIVITAMIN CH: 1.0000 | ORAL_TABLET | Freq: Every day | ORAL | Status: DC

## 2018-12-17 MED ORDER — TETANUS-DIPHTH-ACELL PERTUSSIS 5-2.5-18.5 LF-MCG/0.5 IM SUSP: 0.5000 mL | Freq: Once | INTRAMUSCULAR | Status: DC

## 2018-12-17 MED ORDER — WITCH HAZEL-GLYCERIN EX PADS: 1.0000 "application " | MEDICATED_PAD | CUTANEOUS | Status: DC | PRN

## 2018-12-17 MED ORDER — ONDANSETRON HCL 4 MG/2ML IJ SOLN: 4.0000 mg | Freq: Four times a day (QID) | INTRAMUSCULAR | Status: DC | PRN

## 2018-12-17 MED ORDER — OXYCODONE-ACETAMINOPHEN 5-325 MG PO TABS
1.0000 | ORAL_TABLET | ORAL | Status: DC | PRN
  Administered 2018-12-18: 14:00:00 1 via ORAL
  Filled 2018-12-17: qty 1

## 2018-12-17 MED ORDER — SIMETHICONE 80 MG PO CHEW: 80.0000 mg | CHEWABLE_TABLET | ORAL | Status: DC | PRN

## 2018-12-17 MED ORDER — METHYLERGONOVINE MALEATE 0.2 MG PO TABS: 0.2000 mg | ORAL_TABLET | ORAL | Status: DC | PRN

## 2018-12-17 MED ORDER — LACTATED RINGERS IV SOLN: INTRAVENOUS | Status: DC

## 2018-12-17 MED ORDER — LIDOCAINE HCL (PF) 1 % IJ SOLN
30.0000 mL | INTRAMUSCULAR | Status: DC | PRN
  Administered 2018-12-17: 30 mL via SUBCUTANEOUS
  Filled 2018-12-17: qty 30

## 2018-12-17 MED ORDER — IBUPROFEN 600 MG PO TABS
600.0000 mg | ORAL_TABLET | Freq: Four times a day (QID) | ORAL | Status: DC
  Administered 2018-12-17 – 2018-12-19 (×6): 600 mg via ORAL
  Filled 2018-12-17 (×7): qty 1

## 2018-12-17 MED ORDER — OXYCODONE-ACETAMINOPHEN 5-325 MG PO TABS: 1.0000 | ORAL_TABLET | ORAL | Status: DC | PRN

## 2018-12-17 MED ORDER — METHYLERGONOVINE MALEATE 0.2 MG/ML IJ SOLN: 0.2000 mg | INTRAMUSCULAR | Status: DC | PRN

## 2018-12-17 MED ORDER — DIBUCAINE (PERIANAL) 1 % EX OINT: 1.0000 "application " | TOPICAL_OINTMENT | CUTANEOUS | Status: DC | PRN

## 2018-12-17 MED ORDER — LACTATED RINGERS IV SOLN
INTRAVENOUS | Status: DC
  Administered 2018-12-17 (×2): via INTRAVENOUS

## 2018-12-17 MED ORDER — OXYTOCIN 40 UNITS IN NORMAL SALINE INFUSION - SIMPLE MED: 2.5000 [IU]/h | INTRAVENOUS | Status: DC | PRN

## 2018-12-17 MED ORDER — COCONUT OIL OIL: 1.0000 "application " | TOPICAL_OIL | Status: DC | PRN

## 2018-12-17 MED ORDER — ACETAMINOPHEN 325 MG PO TABS
650.0000 mg | ORAL_TABLET | ORAL | Status: DC | PRN
  Administered 2018-12-19: 08:00:00 650 mg via ORAL
  Filled 2018-12-17: qty 2

## 2018-12-17 MED ORDER — LACTATED RINGERS IV SOLN: 500.0000 mL | INTRAVENOUS | Status: DC | PRN

## 2018-12-17 NOTE — H&P (Signed)
Cynthia Daniels is a 28 y.o. female, G3P1011 at 38.1 weeks, presenting for spontaneous labor.  Prenatal history remarkable for bleeding in third trimester with  betamethasone given, Rh negative.  Today FM+ had SROM at 0405  Patient Active Problem List   Diagnosis Date Noted  . Indication for care in labor or delivery 12/17/2018  . Vaginal bleeding 11/30/2018  . Vaginal bleeding in pregnancy, third trimester 11/29/2018  . Vaginal delivery 05/26/2011    History of present pregnancy: Patient entered care at 14 weeks.   EDC of 38.1  was established by LMP/US.   Anatomy scan: 20  weeks, with normal findings and an posterior placenta.   Additional [place] evaluations:  In hospital Significant prenatal events:  Bleeding early August with Betamethasone given Last evaluation:  Last week OB History    Gravida  3   Para  1   Term  1   Preterm  0   AB  1   Living  1     SAB  1   TAB  0   Ectopic  0   Multiple  0   Live Births  1          Past Medical History:  Diagnosis Date  . Late prenatal care   . Medical history non-contributory   . No pertinent past medical history   . Pneumonia    Past Surgical History:  Procedure Laterality Date  . NO PAST SURGERIES     Family History: family history includes Asthma in her maternal grandmother and mother; Depression in her mother. Social History:  reports that she has never smoked. She has never used smokeless tobacco. She reports previous alcohol use. She reports that she does not use drugs.   Prenatal Transfer Tool  Maternal Diabetes: No Genetic Screening: Normal Maternal Ultrasounds/Referrals: Normal Fetal Ultrasounds or other Referrals:  None Maternal Substance Abuse:  No Significant Maternal Medications: None Significant Maternal Lab Results: Rh negative    ROS:  All 10 systems reviewed and negative except as stated above  No Known Allergies   Dilation: 4.5 Effacement (%): 80 Station: -1 Exam by:: Doree Barthel, RN Blood pressure 124/75, pulse 87, temperature 98.7 F (37.1 C), temperature source Oral, resp. rate 16, height 5\' 5"  (1.651 m), weight 72.6 kg, unknown if currently breastfeeding.  Chest clear Heart RRR without murmur Abd gravid, NT, FH appropriate Pelvic: Per RN Ext: +2/+2  FHR: Category 1 FHT 130 accels, no decels UCs:  Every 3-4  Prenatal labs: ABO, Rh: A Neg Antibody: Neg Rubella:   Imm RPR: Non Reactive (08/08 2126)  HBsAg:   NR HIV:   NR GBS:  Neg Sickle cell/Hgb electrophoresis:  AA Pap:  2020 neg GC:  Neg Chlamydia:  Neg Genetic screenings: Neg Glucola:  166 passed 3 hour Other:  Covid Negative Hgb, 11.4 at 28 weeks       Assessment/Plan: IUP at 38.1 in active labor with SROM clear fluid at 0405 Cat 1 strip Rh negative  Plan: Admit to [facility]  Routine [facility] orders Plans [facility]B Intermittent monitoring  Pleas Koch ProtheroCNM, MSN 12/17/2018, 6:46 AM

## 2018-12-17 NOTE — MAU Note (Addendum)
PT SAYS SROM AT 0405- CLEAR FLUID.  PNC WITH - SAW DR ROBERTS - VE  2-3 CM.   DENIES HSV AND MRSA. GBS- NEG . FEELS MILD UC.

## 2018-12-18 ENCOUNTER — Inpatient Hospital Stay (HOSPITAL_COMMUNITY): Payer: BC Managed Care – PPO | Admitting: Certified Registered Nurse Anesthetist

## 2018-12-18 ENCOUNTER — Encounter (HOSPITAL_COMMUNITY)
Admission: AD | Disposition: A | Discharge status: Home / Self Care | Payer: Self-pay | Source: Home / Self Care | Attending: Obstetrics & Gynecology

## 2018-12-18 ENCOUNTER — Encounter (HOSPITAL_COMMUNITY): Payer: Self-pay | Admitting: Anesthesiology

## 2018-12-18 DIAGNOSIS — Z302 Encounter for sterilization: Secondary | ICD-10-CM

## 2018-12-18 HISTORY — PX: TUBAL LIGATION: SHX77

## 2018-12-18 LAB — CBC
HCT: 29.7 % — ABNORMAL LOW (ref 36.0–46.0)
Hemoglobin: 10 g/dL — ABNORMAL LOW (ref 12.0–15.0)
MCH: 32.6 pg (ref 26.0–34.0)
MCHC: 33.7 g/dL (ref 30.0–36.0)
MCV: 96.7 fL (ref 80.0–100.0)
Platelets: 166 10*3/uL (ref 150–400)
RBC: 3.07 MIL/uL — ABNORMAL LOW (ref 3.87–5.11)
RDW: 13.2 % (ref 11.5–15.5)
WBC: 12.9 10*3/uL — ABNORMAL HIGH (ref 4.0–10.5)
nRBC: 0 % (ref 0.0–0.2)

## 2018-12-18 SURGERY — LIGATION, FALLOPIAN TUBE, POSTPARTUM
Anesthesia: Spinal | Site: Abdomen | Wound class: Clean Contaminated

## 2018-12-18 MED ORDER — MIDAZOLAM HCL 5 MG/5ML IJ SOLN
INTRAMUSCULAR | Status: DC | PRN
  Administered 2018-12-18: .25 mg via INTRAVENOUS

## 2018-12-18 MED ORDER — ONDANSETRON HCL 4 MG/2ML IJ SOLN
INTRAMUSCULAR | Status: AC
  Filled 2018-12-18: qty 2

## 2018-12-18 MED ORDER — MIDAZOLAM HCL 2 MG/2ML IJ SOLN
INTRAMUSCULAR | Status: AC
  Filled 2018-12-18: qty 2

## 2018-12-18 MED ORDER — SODIUM CHLORIDE 0.9 % IR SOLN
Status: DC | PRN
  Administered 2018-12-18: 1

## 2018-12-18 MED ORDER — LACTATED RINGERS IV SOLN
INTRAVENOUS | Status: DC | PRN
  Administered 2018-12-18 (×2): via INTRAVENOUS

## 2018-12-18 MED ORDER — DEXAMETHASONE SODIUM PHOSPHATE 4 MG/ML IJ SOLN
INTRAMUSCULAR | Status: AC
  Filled 2018-12-18: qty 1

## 2018-12-18 MED ORDER — BUPIVACAINE HCL (PF) 0.25 % IJ SOLN
INTRAMUSCULAR | Status: DC | PRN
  Administered 2018-12-18: 20 mL

## 2018-12-18 MED ORDER — LACTATED RINGERS IV SOLN: INTRAVENOUS | Status: DC

## 2018-12-18 MED ORDER — ONDANSETRON HCL 4 MG/2ML IJ SOLN
INTRAMUSCULAR | Status: DC | PRN
  Administered 2018-12-18: 4 mg via INTRAVENOUS

## 2018-12-18 MED ORDER — BUPIVACAINE IN DEXTROSE 0.75-8.25 % IT SOLN
INTRATHECAL | Status: DC | PRN
  Administered 2018-12-18: 1.4 mL via INTRATHECAL

## 2018-12-18 MED ORDER — METOCLOPRAMIDE HCL 10 MG PO TABS
10.0000 mg | ORAL_TABLET | Freq: Once | ORAL | Status: AC
  Administered 2018-12-18: 10 mg via ORAL
  Filled 2018-12-18: qty 1

## 2018-12-18 MED ORDER — FENTANYL CITRATE (PF) 100 MCG/2ML IJ SOLN: 25.0000 ug | INTRAMUSCULAR | Status: DC | PRN

## 2018-12-18 MED ORDER — PHENYLEPHRINE 40 MCG/ML (10ML) SYRINGE FOR IV PUSH (FOR BLOOD PRESSURE SUPPORT)
PREFILLED_SYRINGE | INTRAVENOUS | Status: AC
  Filled 2018-12-18: qty 10

## 2018-12-18 MED ORDER — LACTATED RINGERS IV SOLN
INTRAVENOUS | Status: DC
  Administered 2018-12-18: 09:00:00 20 mL/h via INTRAVENOUS

## 2018-12-18 MED ORDER — IBUPROFEN 600 MG PO TABS
600.0000 mg | ORAL_TABLET | Freq: Four times a day (QID) | ORAL | Status: DC | PRN
  Administered 2018-12-18: 600 mg via ORAL

## 2018-12-18 MED ORDER — FAMOTIDINE 20 MG PO TABS
40.0000 mg | ORAL_TABLET | Freq: Once | ORAL | Status: AC
  Administered 2018-12-18: 09:00:00 40 mg via ORAL
  Filled 2018-12-18: qty 2

## 2018-12-18 MED ORDER — BUPIVACAINE HCL (PF) 0.25 % IJ SOLN
INTRAMUSCULAR | Status: AC
  Filled 2018-12-18: qty 30

## 2018-12-18 MED ORDER — FENTANYL CITRATE (PF) 100 MCG/2ML IJ SOLN
INTRAMUSCULAR | Status: DC | PRN
  Administered 2018-12-18 (×4): 25 ug via INTRAVENOUS

## 2018-12-18 MED ORDER — MEPERIDINE HCL 25 MG/ML IJ SOLN: 6.2500 mg | INTRAMUSCULAR | Status: DC | PRN

## 2018-12-18 MED ORDER — METOCLOPRAMIDE HCL 5 MG/ML IJ SOLN: 10.0000 mg | Freq: Once | INTRAMUSCULAR | Status: DC | PRN

## 2018-12-18 MED ORDER — FENTANYL CITRATE (PF) 100 MCG/2ML IJ SOLN
INTRAMUSCULAR | Status: AC
  Filled 2018-12-18: qty 2

## 2018-12-18 SURGICAL SUPPLY — 29 items
CLIP FILSHIE TUBAL LIGA STRL (Clip) ×2 IMPLANT
CLOTH BEACON ORANGE TIMEOUT ST (SAFETY) ×3 IMPLANT
DERMABOND ADVANCED (GAUZE/BANDAGES/DRESSINGS) ×2
DERMABOND ADVANCED .7 DNX12 (GAUZE/BANDAGES/DRESSINGS) ×1 IMPLANT
DRSG OPSITE POSTOP 3X4 (GAUZE/BANDAGES/DRESSINGS) ×3 IMPLANT
DURAPREP 26ML APPLICATOR (WOUND CARE) ×3 IMPLANT
ELECT REM PT RETURN 9FT ADLT (ELECTROSURGICAL) ×3
ELECTRODE REM PT RTRN 9FT ADLT (ELECTROSURGICAL) ×1 IMPLANT
GLOVE BIOGEL PI IND STRL 7.0 (GLOVE) ×2 IMPLANT
GLOVE BIOGEL PI INDICATOR 7.0 (GLOVE) ×4
GLOVE ECLIPSE 6.5 STRL STRAW (GLOVE) ×3 IMPLANT
GOWN STRL REUS W/TWL LRG LVL3 (GOWN DISPOSABLE) ×6 IMPLANT
NEEDLE HYPO 22GX1.5 SAFETY (NEEDLE) ×3 IMPLANT
NS IRRIG 1000ML POUR BTL (IV SOLUTION) ×3 IMPLANT
PACK ABDOMINAL MINOR (CUSTOM PROCEDURE TRAY) ×3 IMPLANT
PENCIL BUTTON HOLSTER BLD 10FT (ELECTRODE) ×3 IMPLANT
PROTECTOR NERVE ULNAR (MISCELLANEOUS) ×3 IMPLANT
SPONGE LAP 4X18 RFD (DISPOSABLE) IMPLANT
SUT CHROMIC GUT AB #0 18 (SUTURE) IMPLANT
SUT MNCRL AB 4-0 PS2 18 (SUTURE) ×3 IMPLANT
SUT VIC AB 0 CT1 27 (SUTURE) ×2
SUT VIC AB 0 CT1 27XBRD ANBCTR (SUTURE) IMPLANT
SUT VIC AB 3-0 CT1 27 (SUTURE)
SUT VIC AB 3-0 CT1 TAPERPNT 27 (SUTURE) IMPLANT
SUT VICRYL 0 UR6 27IN ABS (SUTURE) ×3 IMPLANT
SYR CONTROL 10ML LL (SYRINGE) ×3 IMPLANT
TOWEL OR 17X24 6PK STRL BLUE (TOWEL DISPOSABLE) ×6 IMPLANT
TRAY FOLEY CATH SILVER 14FR (SET/KITS/TRAYS/PACK) ×3 IMPLANT
WATER STERILE IRR 1000ML POUR (IV SOLUTION) ×3 IMPLANT

## 2018-12-18 NOTE — Transfer of Care (Signed)
Immediate Anesthesia Transfer of Care Note  Patient: Cynthia Daniels  Procedure(s) Performed: POST PARTUM TUBAL LIGATION (N/A Abdomen)  Patient Location: PACU  Anesthesia Type:Spinal  Level of Consciousness: awake, alert  and oriented  Airway & Oxygen Therapy: Patient Spontanous Breathing  Post-op Assessment: Report given to RN and Post -op Vital signs reviewed and stable  Post vital signs: Reviewed and stable  Last Vitals:  Vitals Value Taken Time  BP 109/69 12/18/18 1116  Temp    Pulse 73 12/18/18 1121  Resp 19 12/18/18 1121  SpO2 98 % 12/18/18 1121  Vitals shown include unvalidated device data.  Last Pain:  Vitals:   12/18/18 0840  TempSrc: Oral  PainSc: 0-No pain         Complications: No apparent anesthesia complications

## 2018-12-18 NOTE — Anesthesia Postprocedure Evaluation (Signed)
Anesthesia Post Note  Patient: Cynthia Daniels  Procedure(s) Performed: POST PARTUM TUBAL LIGATION (N/A Abdomen)     Patient location during evaluation: PACU Anesthesia Type: Spinal Level of consciousness: awake and alert Pain management: pain level controlled Vital Signs Assessment: post-procedure vital signs reviewed and stable Respiratory status: spontaneous breathing and respiratory function stable Cardiovascular status: blood pressure returned to baseline and stable Postop Assessment: no headache, no backache, spinal receding and no apparent nausea or vomiting Anesthetic complications: no    Last Vitals:  Vitals:   12/18/18 1130 12/18/18 1145  BP: 109/69 110/66  Pulse: 84 81  Resp: 16 (!) 28  Temp:    SpO2: 100% 98%    Last Pain:  Vitals:   12/18/18 1145  TempSrc:   PainSc: 0-No pain   Pain Goal:    LLE Motor Response: Purposeful movement (12/18/18 1145)   RLE Motor Response: Purposeful movement (12/18/18 1145)       Epidural/Spinal Function Cutaneous sensation: Able to Discern Pressure (12/18/18 1145), Patient able to flex knees: Yes (12/18/18 1145), Patient able to lift hips off bed: No (12/18/18 1145), Back pain beyond tenderness at insertion site: No (12/18/18 1145), Progressively worsening motor and/or sensory loss: No (12/18/18 1145), Bowel and/or bladder incontinence post epidural: No (12/18/18 1145)  Montez Hageman

## 2018-12-18 NOTE — H&P (View-Only) (Signed)
Subjective: Postpartum Day 1: Vaginal delivery, 1st laceration Patient up ad lib, reports no syncope or dizziness.  NPO since midnight Feeding: Breastfeeding Contraceptive plan:  Bilateral tubal ligation  Objective: Vital signs in last 24 hours: Temp:  [98.1 F (36.7 C)-99.1 F (37.3 C)] 98.8 F (37.1 C) (08/27 0440) Pulse Rate:  [60-96] 84 (08/27 0440) Resp:  [15-18] 18 (08/27 0440) BP: (101-118)/(52-71) 111/66 (08/27 0440) SpO2:  [99 %-100 %] 99 % (08/27 0440)  Physical Exam:  General: alert, cooperative and no distress Lochia: appropriate Uterine Fundus: firm Perineum: healing well DVT Evaluation: No evidence of DVT seen on physical exam.   CBC Latest Ref Rng & Units 12/17/2018 11/29/2018 08/30/2015  WBC 4.0 - 10.5 K/uL 12.2(H) 15.2(H) 7.2  Hemoglobin 12.0 - 15.0 g/dL 11.7(L) 11.7(L) 12.9  Hematocrit 36.0 - 46.0 % 34.6(L) 34.4(L) 38.2  Platelets 150 - 400 K/uL 190 207 229     Assessment/Plan: Status post vaginal delivery day 1. Stable Continue current care. Plan for discharge tomorrow, Breastfeeding and Contraception Bilateral tubal ligation today at [place] 12/18/2018, 6:39 AM

## 2018-12-18 NOTE — Lactation Note (Signed)
This note was copied from a baby's chart. Lactation Consultation Note Mom states baby is starting to BF well. This was his best feeding. Mom feeding in cross cradle position. Baby had good body alignment latched well.  Mom has short shaft compressible nipples.  Gave mom hand pump to pre-pump before latching as well as shells to wear in bra this am.   Mom has an 29 yr old daughter that she tried to BF for 2 weeks but the feeding was to painful. Mom stated if this baby doesn't get on deep enough it hurts, then she adjusts his latch.  Newborn behavior, STS, I&O, breast massage, support, positioning, supply and demand discussed. Mom encouraged to feed baby 8-12 times/24 hours and with feeding cues.  Encouraged to call for assistance or questions. Lactation brochure given.  Patient Name: Cynthia Daniels ASNKN'L Date: 12/18/2018 Reason for consult: Initial assessment   Maternal Data Has patient been taught Hand Expression?: Yes Does the patient have breastfeeding experience prior to this delivery?: Yes  Feeding Feeding Type: Breast Fed  LATCH Score Latch: Grasps breast easily, tongue down, lips flanged, rhythmical sucking.  Audible Swallowing: A few with stimulation  Type of Nipple: Everted at rest and after stimulation(short shaft)  Comfort (Breast/Nipple): Filling, red/small blisters or bruises, mild/mod discomfort  Hold (Positioning): No assistance needed to correctly position infant at breast.  LATCH Score: 8  Interventions Interventions: Breast feeding basics reviewed;Breast compression;Shells;Skin to skin;Breast massage;Support pillows;Hand pump;Position options;Pre-pump if needed  Lactation Tools Discussed/Used Tools: Shells;Pump;Comfort gels Shell Type: Inverted Breast pump type: Manual WIC Program: No Pump Review: Setup, frequency, and cleaning;Milk Storage Initiated by:: Allayne Stack RN IBCLC Date initiated:: 12/18/18   Consult Status Consult Status:  Follow-up Date: 12/19/18 Follow-up type: In-patient    Evanie Buckle, Elta Gu[place]oupe 12/18/2018, 5:50 AM

## 2018-12-18 NOTE — Progress Notes (Signed)
Subjective: Postpartum Day 1: Vaginal delivery, 1st laceration Patient up ad lib, reports no syncope or dizziness.  NPO since midnight Feeding: Breastfeeding Contraceptive plan:  Bilateral tubal ligation  Objective: Vital signs in last 24 hours: Temp:  [98.1 F (36.7 C)-99.1 F (37.3 C)] 98.8 F (37.1 C) (08/27 0440) Pulse Rate:  [60-96] 84 (08/27 0440) Resp:  [15-18] 18 (08/27 0440) BP: (101-118)/(52-71) 111/66 (08/27 0440) SpO2:  [99 %-100 %] 99 % (08/27 0440)  Physical Exam:  General: alert, cooperative and no distress Lochia: appropriate Uterine Fundus: firm Perineum: healing well DVT Evaluation: No evidence of DVT seen on physical exam.   CBC Latest Ref Rng & Units 12/17/2018 11/29/2018 08/30/2015  WBC 4.0 - 10.5 K/uL 12.2(H) 15.2(H) 7.2  Hemoglobin 12.0 - 15.0 g/dL 11.7(L) 11.7(L) 12.9  Hematocrit 36.0 - 46.0 % 34.6(L) 34.4(L) 38.2  Platelets 150 - 400 K/uL 190 207 229     Assessment/Plan: Status post vaginal delivery day 1. Stable Continue current care. Plan for discharge tomorrow, Breastfeeding and Contraception Bilateral tubal ligation today at 1000    Arlone Lenhardt Jean ProtheroCNM 12/18/2018, 6:39 AM    

## 2018-12-18 NOTE — Anesthesia Procedure Notes (Signed)
Spinal  Patient location during procedure: OR Staffing Anesthesiologist: Shavonne Ambroise, MD Performed: anesthesiologist  Preanesthetic Checklist Completed: patient identified, site marked, surgical consent, pre-op evaluation, timeout performed, IV checked, risks and benefits discussed and monitors and equipment checked Spinal Block Patient position: sitting Prep: DuraPrep Patient monitoring: heart rate, continuous pulse ox and blood pressure Approach: midline Location: L3-4 Injection technique: single-shot Needle Needle type: Sprotte  Needle gauge: 24 G Needle length: 9 cm Additional Notes Expiration date of kit checked and confirmed. Patient tolerated procedure well, without complications.       

## 2018-12-18 NOTE — OR Nursing (Signed)
Sbar report givne to [employer] mbu rn

## 2018-12-18 NOTE — Discharge Summary (Signed)
SVD with BTL OB Discharge Summary     Patient Name: Cynthia Daniels DOB: 04-25-1990 MRN: [id number]  Date of admission: 12/17/2018 Delivering MD: Sanjuana Kava  Date of delivery: 8/26 Type of delivery: SVD  Newborn Data: Sex: Baby female  Circumcision: out pt circ desired.  Live born female  Birth Weight: 6 lb 15.3 oz (3155 g) APGAR: 8, 9  Newborn Delivery   Birth date/time: 12/17/2018 14:24:00 Delivery type: Vaginal, Spontaneous      Feeding: breast Infant being discharge to home with mother in stable condition.   Admitting diagnosis: 38WKS WATER BROKE Intrauterine pregnancy: [redacted]w[redacted]d     Secondary diagnosis:  Active Problems:   Indication for care in labor or delivery   Encounter for tubal ligation   Normal postpartum course                                Complications: None                                                              Intrapartum Procedures: spontaneous vaginal delivery Postpartum Procedures: P.P. tubal ligation Complications-Operative and Postpartum: bilat labial repaired degree perineal laceration Augmentation: None   History of Present Illness: Ms. Cynthia Daniels is a 28 y.o. female, [id number], who presents at [redacted]w[redacted]d weeks gestation. The patient has been followed at  [facility]  Her pregnancy has been complicated by:  Patient Active Problem List   Diagnosis Date Noted  . Encounter for tubal ligation 12/18/2018  . Normal postpartum course 12/18/2018  . Indication for care in labor or delivery 12/17/2018  . Vaginal bleeding 11/30/2018  . Vaginal bleeding in pregnancy, third trimester 11/29/2018  . Vaginal delivery 05/26/2011    Hospital course:  Onset of Labor With Vaginal Delivery     28 y.o. yo [id number] at [redacted]w[redacted]d was admitted in Active Labor on 12/17/2018. Patient had an uncomplicated labor course as follows:  Membrane Rupture Time/Date: 4:05 AM ,12/17/2018   Intrapartum Procedures: Episiotomy: None [1]                           Lacerations:  Labial [10]  Patient had a delivery of a Viable infant. 12/17/2018  Information for the patient's newborn:  Cynthia, Daniels [[id number]]       Pateint had an uncomplicated postpartum course.  She is ambulating, tolerating a regular diet, passing flatus, and urinating well. Patient is discharged home in stable condition on 12/19/18.  Postpartum Day # 2 : S/P NSVD due to active spontaneous labor. Patient up ad lib, denies syncope or dizziness. Reports consuming regular diet without issues and denies N/V. Patient reports 0 bowel movement + passing flatus.  Denies issues with urination and reports bleeding is "lighter."  Patient is breastfeeding and reports going well.  Desires BTL which was performed by Dr Cynthia Daniels on 6/94 without complications for postpartum contraception.  Pain is being appropriately managed with use of po meds. Will be sent home wiht ibu, and oxy ir for pain control.  HGB drop of 11.7-10, asymptomatic for anemia. Mother o-/baby o-.  Physical exam  Vitals:   12/18/18 1224 12/18/18 1642 12/18/18 2033 12/19/18 0034  BP: 98/69 (!) 103/58 104/63 108/77  Pulse: 84 65 70 67  Resp: 14 16 18 18   Temp: 99.1 F (37.3 C) 99.6 F (37.6 C) 98.5 F (36.9 C) 98 F (36.7 C)  TempSrc: Oral Oral Oral Oral  SpO2: 99% 97% 98% 100%  Weight:      Height:       General: alert, cooperative and no distress Lochia: appropriate Uterine Fundus: firm Incision: Healing well with no significant drainage, No significant erythema, Dressing is clean, dry, and intact, honeycomb dressing CDI Perineum: Inact, no hemtomas DVT Evaluation: No evidence of DVT seen on physical exam. Negative Homan's sign. No cords or calf tenderness. No significant calf/ankle edema.  Labs: Lab Results  Component Value Date   WBC 12.9 (H) 12/18/2018   HGB 10.0 (L) 12/18/2018   HCT 29.7 (L) 12/18/2018   MCV 96.7 12/18/2018   PLT 166 12/18/2018   CMP Latest Ref Rng & Units  08/30/2015  Glucose 65 - 99 mg/dL 409(W113(H)  BUN 6 - 20 mg/dL 12  Creatinine 1.190.44 - 1.471.00 mg/dL 8.290.56  Sodium 562135 - 130145 mmol/L 136  Potassium 3.5 - 5.1 mmol/L 3.8  Chloride 101 - 111 mmol/L 106  CO2 22 - 32 mmol/L 26  Calcium 8.9 - 10.3 mg/dL 9.1    Date of discharge: 12/19/2018 Discharge Diagnoses: Term Pregnancy-delivered and with PP BTL Discharge instruction: per After Visit Summary and "Baby and Me Booklet".  After visit meds:   Activity:           unrestricted and pelvic rest Advance as tolerated. Pelvic rest for 6 weeks.  Diet:                routine Medications: PNV, Ibuprofen and oxy IR Postpartum contraception: Tubal Ligation Condition:  Pt discharge to home with baby in stable  Meds: Allergies as of 12/19/2018   No Known Allergies     Medication List    STOP taking these medications   benzonatate 100 MG capsule Commonly known as: TESSALON   guaiFENesin-dextromethorphan 100-10 MG/5ML syrup Commonly known as: ROBITUSSIN DM   PRESCRIPTION MEDICATION     TAKE these medications   acetaminophen 500 MG tablet Commonly known as: TYLENOL Take 1,000 mg by mouth every 6 (six) hours as needed for mild pain.   ibuprofen 600 MG tablet Commonly known as: ADVIL Take 1 tablet (600 mg total) by mouth every 6 (six) hours.   loratadine 10 MG tablet Commonly known as: CLARITIN Take 10 mg by mouth daily.   oxyCODONE-acetaminophen 5-325 MG tablet Commonly known as: PERCOCET/ROXICET Take 1 tablet by mouth every 4 (four) hours as needed (pain scale 4-7).   prenatal multivitamin Tabs tablet Take 1 tablet by mouth daily at 12 noon.            Discharge Care Instructions  (From admission, onward)         Start     Ordered   12/19/18 0000  Discharge wound care:    Comments: Take dressing off on day 5-7 postpartum.  Report increased drainage, redness or warmth. Clean with water, let soap trickle down body. Can leave steri strips on until they fall off or take them off  gently at day 10. Keep open to air, clean and dry.   12/19/18 0141          Discharge Follow Up:  Follow-up Information    [facility] up [facility] 6 week(s).   Specialty: Obstetrics and Gynecology Contact  information: [address] [place] [address] [phone]           Hoffman, NP-C, CNM 12/19/2018, 1:42 AM  Dale Hamilton, FNP

## 2018-12-18 NOTE — Op Note (Signed)
Preop diagnosis: Desire for sterilization  Postop diagnosis: Same  Anesthesia: Epidural  Anesthesiologist: Dr. Marcell Barlow  Procedure: Postpartum bilateral tubal ligation with Filschie clips  Surgeon: Dr. Cletis Media  Asst.: None  Estimated blood loss: Minimal  Procedure:  After being informed of the planned procedure with possible complications, including bleeding, infection, injury to other organs, irreversibility of tubal ligation and failure rate of 1 in 500,  informed consent is obtained and patient is taken to OR.  She is given spinal anesthesia without any complication . She is then placed in  dorsal decubitus position, prepped and draped in a sterile fashion. A Foley catheter is inserted.  After assessing adequate level of anesthesia, we infiltrate the umbilical area using  10 cc of Marcaine 0.25. We then perform a semi-elliptical incision which was brought down bluntly to the fascia. The fascia is identified and grasped with Coker forceps and incised with Mayo scissors. Peritoneum is entered bluntly. Using Trendelenburg position, moist packing and long retractors, we are able to identify the left tube which was then grasped with  Babcock forceps and exteriorized until the fimbria is visualized. We placed a Filschie clip in the isthmo-ampullary section including part of the mesosalpynx. Clip placement is confirmed adequate. We proceed in the exact same fashion on the right side again after having  visualized the the fimbriae.  All retractors and sponges are removed. The fascia is closed with a running suture of 0 Vicryl. The skin is closed with a subcuticular suture of 4-0 Monocryl and Dermabond.  Instrument and sponge count was complete x2. Estimated blood loss is minimal. The procedure is well tolerated by the patient who is taken to recovery room in a well and stable condition.  Specimen: none

## 2018-12-18 NOTE — Interval H&P Note (Signed)
History and Physical Interval Note:  12/18/2018 10:15 AM  Cynthia Daniels  has presented today for surgery, with the diagnosis of PP BTL--desires infertiltiy.  The various methods of treatment have been discussed with the patient and family. After consideration of risks, benefits and other options for treatment, the patient has consented to  Procedure(s): POST PARTUM TUBAL LIGATION (N/A) as a surgical intervention.  The patient's history has been reviewed, patient examined, no change in status, stable for surgery.  I have reviewed the patient's chart and labs.  Questions were answered to the patient's satisfaction.     Katharine Look A Merrin Mcvicker

## 2018-12-18 NOTE — Anesthesia Preprocedure Evaluation (Signed)
Anesthesia Evaluation  Patient identified by MRN, date of birth, ID band Patient awake    Reviewed: Allergy & Precautions, NPO status , Patient's Chart, lab work & pertinent test results  Airway Mallampati: II  TM Distance: >3 FB Neck ROM: Full    Dental no notable dental hx.    Pulmonary neg pulmonary ROS,    Pulmonary exam normal breath sounds clear to auscultation       Cardiovascular negative cardio ROS Normal cardiovascular exam Rhythm:Regular Rate:Normal     Neuro/Psych negative neurological ROS  negative psych ROS   GI/Hepatic negative GI ROS, Neg liver ROS,   Endo/Other  negative endocrine ROS  Renal/GU negative Renal ROS  negative genitourinary   Musculoskeletal negative musculoskeletal ROS (+)   Abdominal   Peds negative pediatric ROS (+)  Hematology negative hematology ROS (+)   Anesthesia Other Findings   Reproductive/Obstetrics negative OB ROS                             Anesthesia Physical Anesthesia Plan  ASA: II  Anesthesia Plan: Spinal   Post-op Pain Management:    Induction: Intravenous  PONV Risk Score and Plan: 2 and Treatment may vary due to age or medical condition  Airway Management Planned: Natural Airway  Additional Equipment:   Intra-op Plan:   Post-operative Plan:   Informed Consent: I have reviewed the patients History and Physical, chart, labs and discussed the procedure including the risks, benefits and alternatives for the proposed anesthesia with the patient or authorized representative who has indicated his/her understanding and acceptance.     Dental advisory given  Plan Discussed with: CRNA  Anesthesia Plan Comments:         Anesthesia Quick Evaluation

## 2018-12-19 MED ORDER — IBUPROFEN 600 MG PO TABS: 600.0000 mg | ORAL_TABLET | Freq: Four times a day (QID) | ORAL | 0 refills | Status: AC

## 2018-12-19 MED ORDER — OXYCODONE-ACETAMINOPHEN 5-325 MG PO TABS: 1.0000 | ORAL_TABLET | ORAL | 0 refills | Status: DC | PRN

## 2018-12-19 NOTE — Lactation Note (Signed)
This note was copied from a baby's chart. Lactation Consultation Note  Patient Name: Cynthia Daniels [id number] Date: 12/19/2018 Reason for consult: Follow-up assessment Baby is 42 hours old/7% weight loss.  Mom is currently feeding baby in cross cradle hold.  Positioning and latch look  good.  Mom states she has initial latch on pain that subsides quickly.  Discussed milk coming to volume and the prevention and treatment of engorgement.  Mom has a pump at home.  Reviewed lactation outpatient services and support and encouraged to call prn.  Maternal Data    Feeding Feeding Type: Breast Fed  LATCH Score Latch: Grasps breast easily, tongue down, lips flanged, rhythmical sucking.  Audible Swallowing: A few with stimulation  Type of Nipple: Everted at rest and after stimulation  Comfort (Breast/Nipple): Filling, red/small blisters or bruises, mild/mod discomfort  Hold (Positioning): No assistance needed to correctly position infant at breast.  LATCH Score: 8  Interventions Interventions: Breast feeding basics reviewed;Coconut oil  Lactation Tools Discussed/Used     Consult Status Consult Status: Complete Follow-up type: Call as needed    Ave Filter 12/19/2018, 8:46 AM

## 2018-12-20 LAB — TYPE AND SCREEN
ABO/RH(D): A NEG
Antibody Screen: POSITIVE
Unit division: 0
Unit division: 0

## 2018-12-20 LAB — BPAM RBC
Blood Product Expiration Date: 202009192359
Blood Product Expiration Date: 202009192359
Unit Type and Rh: 600
Unit Type and Rh: 600

## 2019-10-16 ENCOUNTER — Telehealth: Payer: Medicaid Other | Admitting: Nurse Practitioner

## 2019-10-16 DIAGNOSIS — W57XXXA Bitten or stung by nonvenomous insect and other nonvenomous arthropods, initial encounter: Secondary | ICD-10-CM | POA: Diagnosis not present

## 2019-10-16 DIAGNOSIS — R21 Rash and other nonspecific skin eruption: Secondary | ICD-10-CM

## 2019-10-16 NOTE — Progress Notes (Signed)
Thank you for describing your tick bite, Here is how we plan to help! Based on the information that you shared with me it looks like you have An uncomplicated tick bite that just occurred and can be closely follow using the instructions in your care plan.  In most cases a tick bite is painless and does not itch.  Most tick bites in which the tick is quickly removed do not require prescriptions. Ticks can transmit several diseases if they are infected and remain attacked to your skin. Therefore the length that the tick was attached and any symptoms you have experienced after the bite are import to accurately develop your custom treatment plan. In most cases a single dose of doxycycline may prevent the development of a more serious condition.  Based on your information I have Provided a home care guide for tick bites and  instructions on when to call for help.  Which ticks  are associated with illness?  The Wood Tick (dog tick) is the size of a watermelon seed and can sometimes transmit Rocky [place] spotted fever and [place] tick fever.   The Deer Tick (black-legged tick) is between the size of a poppy seed (pin head) and an apple seed, and can sometimes transmit Lyme disease.  A brown to black tick with a white splotch on its back is likely a female Amblyomma americanum ([place] tick). This tick has been associated with Southern Tick Associated illness ( STARI)  Lyme disease has become the most common tick-borne illness in the [place]. The risk of Lyme disease following a recognized deer tick bite is estimated to be 1%.  The majority of cases of Lyme disease start with a bull's eye rash at the site of the tick bite. The rash can occur days to weeks (typically 7-10 days) after a tick bite. Treatment with antibiotics is indicated if this rash appears. Flu-like symptoms may accompany the rash, including: fever, chills, headaches, muscle aches, and fatigue. Removing ticks promptly may  prevent tick borne disease.  What can be used to prevent Tick Bites?   Insect repellant with at leas 20% DEET.  Wearing long pants with sock and shoes.  Avoiding tall grass and heavily wooded areas.  Checking your skin after being outdoors.  Shower with a washcloth after outdoor exposures.  HOME CARE ADVICE FOR TICK BITE  1. Wood Tick Removal:  o Use a pair of tweezers and grasp the wood tick close to the skin (on its head). Pull the wood tick straight upward without twisting or crushing it. Maintain a steady pressure until it releases its grip.   o If tweezers aren't available, use fingers, a loop of thread around the jaws, or a needle between the jaws for traction.  o Note: covering the tick with petroleum jelly, nail polish or rubbing alcohol doesn't work. Neither does touching the tick with a hot or cold object. 2. Tiny Deer Tick Removal:   o Needs to be scraped off with a knife blade or credit card edge. o Place tick in a sealed container (e.g. glass jar, zip lock plastic bag), in case your doctor wants to see it. 3. Tick's Head Removal:  o If the wood tick's head breaks off in the skin, it must be removed. Clean the skin. Then use a sterile needle to uncover the head and lift it out or scrape it off.  o If a very small piece of the head remains, the skin will eventually slough it off.   4. Antibiotic Ointment:  o Wash the wound and your hands with soap and water after removal to prevent catching any tick disease.  Apply an over the counter antibiotic ointment (e.g. bacitracin) to the bite once. 5. Expected Course: Tick bites normally don't itch or hurt. That's why they often go unnoticed. 6. Call Your Doctor If:  o You can't remove the tick or the tick's head o Fever, a severe head ache, or rash occur in the next 2 weeks o Bite begins to look infected o Lyme's disease is common in your area o You have not had a tetanus in the last 10 years o Your current symptoms become worse     MAKE SURE YOU   Understand these instructions.  Will watch your condition.  Will get help right away if you are not doing well or get worse.   Thank you for choosing an e-visit.  Your e-visit answers were reviewed by a board certified advanced clinical practitioner to complete your personal care plan. Depending upon the condition, your plan could have included both over the counter or prescription medications. Please review your pharmacy choice. If there is a problem you may use MyChart messaging to have the prescription routed to another pharmacy. Your safety is important to [place]. If you have drug allergies check your prescription carefully.   You can use MyChart to ask questions about today's visit, request a non-urgent call back, or ask for a work or school excuse for 24 hours related to this e-Visit. If it has been greater than 24 hours you will need to follow up with your provider, or enter a new e-Visit to address those concerns.  You will get an email in the next two days asking about your experience. I hope  that your e-visit has been valuable and will speed your recovery  5-10 minutes spent reviewing and documenting in chart.

## 2023-07-11 ENCOUNTER — Ambulatory Visit (HOSPITAL_COMMUNITY): Admission: EM | Admit: 2023-07-11 | Discharge: 2023-07-11 | Disposition: A | Discharge status: Home / Self Care

## 2023-07-11 ENCOUNTER — Encounter (HOSPITAL_COMMUNITY): Payer: Self-pay

## 2023-07-11 DIAGNOSIS — J069 Acute upper respiratory infection, unspecified: Secondary | ICD-10-CM

## 2023-07-11 LAB — POC COVID19/FLU A&B COMBO
Covid Antigen, POC: NEGATIVE
Influenza A Antigen, POC: NEGATIVE
Influenza B Antigen, POC: NEGATIVE

## 2023-07-11 MED ORDER — BENZONATATE 100 MG PO CAPS: 100.0000 mg | ORAL_CAPSULE | Freq: Three times a day (TID) | ORAL | 0 refills | Status: AC | PRN

## 2023-07-11 MED ORDER — PROMETHAZINE-DM 6.25-15 MG/5ML PO SYRP: 5.0000 mL | ORAL_SOLUTION | Freq: Every evening | ORAL | 0 refills | Status: AC | PRN

## 2023-07-11 NOTE — ED Triage Notes (Signed)
 Patient here today with c/o cough, chills, body aches, and some nasal congestion since Saturday. Patient states that she had the flu last week. Patient took Tamiflu. Her son also had flu and her daughter had Covid. She has taken Dayquil and Nyquil with no relief.

## 2023-07-11 NOTE — ED Provider Notes (Signed)
 [facility]    CSN: [id number] Arrival date & time: 07/11/23  0911      History   Chief Complaint Chief Complaint  Patient presents with   Cough    HPI Cynthia Daniels is a 33 y.o. female.   Patient presents today with 5-day history of dry cough and burning in her chest from coughing.  No fever, body aches or chills, shortness of breath, chest tightness, runny or stuffy nose, sore throat, ear pain.  She does endorse a headache and some nausea and diarrhea with decreased appetite has been ongoing since the symptoms began.  Reports last week, she and her child tested positive for influenza and her other child tested positive for COVID-19.  She has taken DayQuil and NyQuil for symptoms without much improvement.  She went to an alternate urgent care 3 days ago, was tested for flu and COVID and was negative, and was prescribed a cough syrup that is not helping her symptoms.    Past Medical History:  Diagnosis Date   Late prenatal care    Medical history non-contributory    No pertinent past medical history    Pneumonia     Patient Active Problem List   Diagnosis Date Noted   Encounter for tubal ligation 12/18/2018   Normal postpartum course 12/18/2018   Indication for care in labor or delivery 12/17/2018   Vaginal bleeding 11/30/2018   Vaginal bleeding in pregnancy, third trimester 11/29/2018   Vaginal delivery 05/26/2011    Past Surgical History:  Procedure Laterality Date   NO PAST SURGERIES     TUBAL LIGATION N/A 12/18/2018   Procedure: POST PARTUM TUBAL LIGATION;  Surgeon: Silverio Lay, MD;  Location: [facility];  Service: Gynecology;  Laterality: N/A;    OB History     Gravida  3   Para  2   Term  2   Preterm  0   AB  1   Living  2      SAB  1   IAB  0   Ectopic  0   Multiple  0   Live Births  2            Home Medications    Prior to Admission medications   Medication Sig Start Date End Date Taking? Authorizing Provider   benzonatate (TESSALON) 100 MG capsule Take 1 capsule (100 mg total) by mouth 3 (three) times daily as needed for cough. Do not take with alcohol or while operating or driving heavy machinery 1/32/44  Yes Cathlean Marseilles A, NP  promethazine-dextromethorphan (PROMETHAZINE-DM) 6.25-15 MG/5ML syrup Take 5 mLs by mouth at bedtime as needed for cough. Do not take with alcohol or while driving or operating heavy machinery.  May cause drowsiness. 07/11/23  Yes Valentino Nose, NP  acetaminophen (TYLENOL) 500 MG tablet Take 1,000 mg by mouth every 6 (six) hours as needed for mild pain.    [provider]  ibuprofen (ADVIL) 600 MG tablet Take 1 tablet (600 mg total) by mouth every 6 (six) hours. 12/19/18   Dale Berlin, FNP  loratadine (CLARITIN) 10 MG tablet Take 10 mg by mouth daily.    [provider]  sertraline (ZOLOFT) 25 MG tablet Take 25 mg by mouth daily.    [provider]    Family History Family History  Problem Relation Age of Onset   Asthma Mother    Depression Mother    Asthma Maternal Grandmother    Anesthesia problems Neg  Hx    Hypotension Neg Hx    Malignant hyperthermia Neg Hx    Pseudochol deficiency Neg Hx     Social History Social History   Tobacco Use   Smoking status: Never   Smokeless tobacco: Never  Substance Use Topics   Alcohol use: Not Currently   Drug use: No     Allergies   Patient has no known allergies.   Review of Systems Review of Systems Per HPI  Physical Exam Triage Vital Signs ED Triage Vitals  Encounter Vitals Group     BP 07/11/23 1022 127/77     Systolic BP Percentile --      Diastolic BP Percentile --      Pulse Rate 07/11/23 1022 85     Resp 07/11/23 1022 16     Temp 07/11/23 1022 98.3 F (36.8 C)     Temp Source 07/11/23 1022 Oral     SpO2 07/11/23 1022 98 %     Weight 07/11/23 1023 145 lb (65.8 kg)     Height 07/11/23 1023 5\' 5"  (1.651 m)     Head Circumference --      Peak Flow --      Pain  Score 07/11/23 1023 4     Pain Loc --      Pain Education --      Exclude from Growth Chart --    No data found.  Updated Vital Signs BP 127/77 (BP Location: Left Arm)   Pulse 85   Temp 98.3 F (36.8 C) (Oral)   Resp 16   Ht 5\' 5"  (1.651 m)   Wt 145 lb (65.8 kg)   LMP 07/06/2023 (Exact Date)   SpO2 98%   Breastfeeding No   BMI 24.13 kg/m   Visual Acuity Right Eye Distance:   Left Eye Distance:   Bilateral Distance:    Right Eye Near:   Left Eye Near:    Bilateral Near:     Physical Exam Vitals and nursing note reviewed.  Constitutional:      General: She is not in acute distress.    Appearance: Normal appearance. She is not ill-appearing or toxic-appearing.  HENT:     Head: Normocephalic and atraumatic.     Right Ear: Tympanic membrane, ear canal and external ear normal.     Left Ear: Tympanic membrane, ear canal and external ear normal.     Nose: No congestion or rhinorrhea.     Mouth/Throat:     Mouth: Mucous membranes are moist.     Pharynx: Oropharynx is clear. Posterior oropharyngeal erythema present. No oropharyngeal exudate.     Comments: Post nasal drainage Eyes:     General: No scleral icterus.    Extraocular Movements: Extraocular movements intact.  Cardiovascular:     Rate and Rhythm: Normal rate and regular rhythm.  Pulmonary:     Effort: Pulmonary effort is normal. No respiratory distress.     Breath sounds: Normal breath sounds. No wheezing, rhonchi or rales.  Musculoskeletal:     Cervical back: Normal range of motion and neck supple.  Lymphadenopathy:     Cervical: No cervical adenopathy.  Skin:    General: Skin is warm and dry.     Coloration: Skin is not jaundiced or pale.     Findings: No erythema or rash.  Neurological:     Mental Status: She is alert and oriented to person, place, and time.  Psychiatric:        Behavior: Behavior  is cooperative.      UC Treatments / Results  Labs (all labs ordered are listed, but only abnormal  results are displayed) Labs Reviewed  POC COVID19/FLU A&B COMBO    EKG   Radiology No results found.  Procedures Procedures (including critical care time)  Medications Ordered in UC Medications - No data to display  Initial Impression / Assessment and Plan / UC Course  I have reviewed the triage vital signs and the nursing notes.  Pertinent labs & imaging results that were available during my care of the patient were reviewed by me and considered in my medical decision making (see chart for details).   Patient is well-appearing, normotensive, afebrile, not tachycardic, not tachypneic, oxygenating well on room air.    1. Viral URI with cough Overall, vitals and exam are reassuring today Suspect viral etiology COVID-19 and influenza testing is negative Supportive care discussed with patient and reassurance provided-no signs of bronchitis or pneumonia Treat with cough suppressant medication Return and ER precautions discussed Work excuse provided  The patient was given the opportunity to ask questions.  All questions answered to their satisfaction.  The patient is in agreement to this plan.   Final Clinical Impressions(s) / UC Diagnoses   Final diagnoses:  Viral URI with cough     Discharge Instructions      You have a viral upper respiratory infection.  Symptoms should improve over the next week to 10 days.  If you develop chest pain or shortness of breath, go to the emergency room.  We have tested you today for COVID-19.  You will see the results in Mychart and we will call you with positive results.  Please stay home and isolate until you are aware of the results.    Some things that can make you feel better are: - Increased rest - Increasing fluid with water/sugar free electrolytes - Acetaminophen and ibuprofen as needed for fever/pain - Salt water gargling, chloraseptic spray and throat lozenges - OTC guaifenesin (Mucinex) 600 mg twice daily - Saline sinus  flushes or a neti pot - Humidifying the air   ED Prescriptions     Medication Sig Dispense Auth. Provider   benzonatate (TESSALON) 100 MG capsule Take 1 capsule (100 mg total) by mouth 3 (three) times daily as needed for cough. Do not take with alcohol or while operating or driving heavy machinery 21 capsule Cathlean Marseilles A, NP   promethazine-dextromethorphan (PROMETHAZINE-DM) 6.25-15 MG/5ML syrup Take 5 mLs by mouth at bedtime as needed for cough. Do not take with alcohol or while driving or operating heavy machinery.  May cause drowsiness. 118 mL Valentino Nose, NP      PDMP not reviewed this encounter.   Valentino Nose, NP 07/11/23 1310

## 2023-07-11 NOTE — Discharge Instructions (Addendum)
 You have a viral upper respiratory infection.  Symptoms should improve over the next week to 10 days.  If you develop chest pain or shortness of breath, go to the emergency room.  COVID-19 and influenza testing is negative today.  Some things that can make you feel better are: - Increased rest - Increasing fluid with water/sugar free electrolytes - Acetaminophen and ibuprofen as needed for fever/pain - Salt water gargling, chloraseptic spray and throat lozenges - OTC guaifenesin (Mucinex) 600 mg twice daily - Saline sinus flushes or a neti pot - Humidifying the air -Tessalon Perles every 8 hours as needed for dry cough and cough syrup at night time as needed for dry cough

## 2023-09-30 ENCOUNTER — Encounter: Admitting: Obstetrics and Gynecology
